# Patient Record
Sex: Female | Born: 1995 | Hispanic: Yes | Marital: Married | Smoking: Never smoker
Health system: Southern US, Community
[De-identification: ages and names within clinical notes are randomized; demographics above are authoritative.]

## PROBLEM LIST (undated history)

## (undated) ENCOUNTER — Emergency Department (HOSPITAL_COMMUNITY): Admission: EM | Payer: Self-pay | Source: Home / Self Care

## (undated) DIAGNOSIS — F32A Depression, unspecified: Secondary | ICD-10-CM

## (undated) DIAGNOSIS — F419 Anxiety disorder, unspecified: Secondary | ICD-10-CM

## (undated) DIAGNOSIS — E039 Hypothyroidism, unspecified: Secondary | ICD-10-CM

## (undated) DIAGNOSIS — F329 Major depressive disorder, single episode, unspecified: Secondary | ICD-10-CM

---
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
## 2013-01-01 ENCOUNTER — Ambulatory Visit (INDEPENDENT_AMBULATORY_CARE_PROVIDER_SITE_OTHER): Payer: BC Managed Care – PPO | Admitting: Family Medicine

## 2013-01-01 VITALS — BP 109/73 | HR 93 | Temp 98.5°F | Resp 18 | Ht 61.0 in | Wt 209.0 lb

## 2013-01-01 DIAGNOSIS — N949 Unspecified condition associated with female genital organs and menstrual cycle: Secondary | ICD-10-CM

## 2013-01-01 DIAGNOSIS — R42 Dizziness and giddiness: Secondary | ICD-10-CM

## 2013-01-01 DIAGNOSIS — N926 Irregular menstruation, unspecified: Secondary | ICD-10-CM

## 2013-01-01 LAB — POCT CBC
HCT, POC: 40.5 % (ref 37.7–47.9)
Lymph, poc: 3 (ref 0.6–3.4)
MCHC: 30.6 g/dL — AB (ref 31.8–35.4)
MCV: 95.7 fL (ref 80–97)
POC Granulocyte: 4.3 (ref 2–6.9)
POC LYMPH PERCENT: 38 %L (ref 10–50)
RDW, POC: 13.5 %

## 2013-01-01 LAB — POCT URINALYSIS DIPSTICK
Nitrite, UA: NEGATIVE
Protein, UA: NEGATIVE
pH, UA: 6.5

## 2013-01-01 LAB — POCT UA - MICROSCOPIC ONLY
Crystals, Ur, HPF, POC: NEGATIVE
Yeast, UA: NEGATIVE

## 2013-01-01 LAB — POCT URINE PREGNANCY: Preg Test, Ur: NEGATIVE

## 2013-01-01 MED ORDER — MECLIZINE HCL 25 MG PO TABS: 25.0000 mg | ORAL_TABLET | Freq: Three times a day (TID) | ORAL | Status: DC | PRN

## 2013-01-01 NOTE — Progress Notes (Signed)
Subjective:  This chart was scribed for Jenny Sorenson, MD, by Yevette Edwards, ED Scribe. This  patient's care was started at 6:38 PM.    Patient ID: Jenny Jackson, female    DOB: Apr 20, 1995, 17 y.o.   MRN: [id number]  HPI HPI Comments: Jenny Jackson is a 17 y.o. female who presents to the [facility] complaining of intermittent lightheadedness which began yesterday and which has been accompanied with the associated symptoms of a frontal headache and nausea. She reports that she has had two episodes of lightheadedness following eating. The pt reports that she experienced the lightheadedness for approximately nine hours yesterday, and she has experienced it for 4 hours today. Her lightheadedness continues at bedside. The pt states the lightheadedness makes her she feel unbalanced and as if she is going to faint, and she states the lightheadedness is exacerbated with movement. The pt states she feels like she is moving when she is stationary, but she denies feeling like the room is spinning. The nausea associated with the lightheadedness is intermittent, but it only occurs when she is feeling lightheaded.    She denies any dysuria, constipation, sleep disturbances, chest pains, palpitations, or appetite changes.   She denies smoking cigarettes.   She had cold symptoms 1-2 weeks ago.  She has a h/o asthma, and she has recently experienced mild asthmatic issue. She reports that she has used her inhaler as needed, but she has not used her inhaler in the past two days.   She stopped taking Zoloft three weeks ago because her insurance did not cover it.  She used Depo, but she reports gaining weight. She will be receiving an IUD next month.   Past Medical History  Diagnosis Date  . Asthma   . Depression   . Anxiety     Current Outpatient Prescriptions on File Prior to Visit  Medication Sig Dispense Refill  . buPROPion (WELLBUTRIN XL) 300 MG 24 hr tablet Take 1 tablet (300 mg total) by mouth daily.  30  tablet  0  . medroxyPROGESTERone (DEPO-PROVERA) 150 MG/ML injection Inject 1 mL (150 mg total) into the muscle every 3 (three) months. Patient may resume injection schedule as previously established by outpatient prescribing providing, and may resume injections with outpatient prescribing provider.      . traZODone (DESYREL) 50 MG tablet Take 1 tablet (50 mg total) by mouth at bedtime.  30 tablet  0   No current facility-administered medications on file prior to visit.   No Known Allergies   Review of Systems  Constitutional: Negative for appetite change.  HENT: Positive for congestion, rhinorrhea and sneezing.   Respiratory: Positive for cough.   Cardiovascular: Negative for chest pain and palpitations.  Gastrointestinal: Positive for nausea. Negative for vomiting.  Genitourinary: Negative for dysuria and difficulty urinating.  Neurological: Positive for light-headedness and headaches. Negative for dizziness.  Psychiatric/Behavioral: Negative for sleep disturbance.    Vitals: BP 122/70  Pulse 81  Temp(Src) 98.5 F (36.9 C) (Oral)  Resp 18  Ht 5\' 1"  (1.549 m)  Wt 209 lb (94.802 kg)  BMI 39.51 kg/m2  SpO2 99%     Objective:   Physical Exam  Nursing note and vitals reviewed. Constitutional: She is oriented to person, place, and time. She appears well-developed and well-nourished. No distress.  HENT:  Head: Normocephalic and atraumatic.  Right Ear: Tympanic membrane is retracted.  Left Ear: Tympanic membrane is retracted.  Nose: Mucosal edema present.  Mouth/Throat: Posterior oropharyngeal erythema present.  Eyes: EOM are  normal.  Neck: Full passive range of motion without pain. Neck supple. No spinous process tenderness and no muscular tenderness present. No tracheal deviation present. No thyromegaly present.  Cardiovascular: Normal rate.   Pulmonary/Chest: Effort normal. No respiratory distress.  Musculoskeletal: Normal range of motion.  Lymphadenopathy:       Head  (right side): No submental, no submandibular, no preauricular and no posterior auricular adenopathy present.       Head (left side): No submental, no submandibular, no preauricular and no posterior auricular adenopathy present.    She has no cervical adenopathy.  Neurological: She is alert and oriented to person, place, and time. She has normal strength and normal reflexes. She displays no atrophy and no tremor. No cranial nerve deficit or sensory deficit. She exhibits normal muscle tone. She displays a negative Romberg sign. Coordination and gait normal.  Negative Dix-Hallpike bilaterally.   Skin: Skin is warm and dry.  Psychiatric: She has a normal mood and affect. Her behavior is normal.      EKG: NSR no ischemic changes Assessment & Plan:   Dizziness and giddiness - Plan: POCT CBC, POCT UA - Microscopic Only, POCT urinalysis dipstick, POCT glucose (manual entry), Comprehensive metabolic panel, Sedimentation Rate, EKG 12-Lead, EKG 12-Lead, Urine culture  Period has come late - Plan: POCT urine pregnancy Unknown etiology of pt's symptoms. I do not think she is having vertigo but will do trial of meclizine anyway. Rec watchful waiting, wait on pending labs, and recheck here in 2d unless sxs have resolved. If cont or worsens at all, cons head imaging. Meds ordered this encounter  Medications  . meclizine (ANTIVERT) 25 MG tablet    Sig: Take 1 tablet (25 mg total) by mouth 3 (three) times daily as needed for dizziness.    Dispense:  30 tablet    Refill:  0    I personally performed the services described in this documentation, which was scribed in my presence. The recorded information has been reviewed and considered, and addended by me as needed.  Jenny Sorenson, MD MPH

## 2013-01-02 LAB — COMPREHENSIVE METABOLIC PANEL
AST: 23 U/L (ref 0–37)
Alkaline Phosphatase: 79 U/L (ref 39–117)
BUN: 11 mg/dL (ref 6–23)
Calcium: 9.2 mg/dL (ref 8.4–10.5)
Creat: 0.69 mg/dL (ref 0.50–1.10)

## 2013-01-04 LAB — URINE CULTURE

## 2013-01-05 ENCOUNTER — Encounter: Payer: Self-pay | Admitting: Family Medicine

## 2015-10-15 DIAGNOSIS — F900 Attention-deficit hyperactivity disorder, predominantly inattentive type: Secondary | ICD-10-CM | POA: Diagnosis present

## 2015-10-15 DIAGNOSIS — E669 Obesity, unspecified: Secondary | ICD-10-CM | POA: Diagnosis present

## 2015-10-15 DIAGNOSIS — Z825 Family history of asthma and other chronic lower respiratory diseases: Secondary | ICD-10-CM

## 2015-10-15 DIAGNOSIS — J45909 Unspecified asthma, uncomplicated: Secondary | ICD-10-CM | POA: Diagnosis present

## 2015-10-15 DIAGNOSIS — Z818 Family history of other mental and behavioral disorders: Secondary | ICD-10-CM

## 2015-10-15 DIAGNOSIS — E039 Hypothyroidism, unspecified: Secondary | ICD-10-CM | POA: Diagnosis present

## 2015-10-15 DIAGNOSIS — K8071 Calculus of gallbladder and bile duct without cholecystitis with obstruction: Principal | ICD-10-CM | POA: Diagnosis present

## 2015-10-15 DIAGNOSIS — Z6841 Body Mass Index (BMI) 40.0 and over, adult: Secondary | ICD-10-CM

## 2015-10-15 DIAGNOSIS — F411 Generalized anxiety disorder: Secondary | ICD-10-CM | POA: Diagnosis present

## 2015-10-16 ENCOUNTER — Inpatient Hospital Stay (HOSPITAL_COMMUNITY)
Admission: EM | Admit: 2015-10-16 | Discharge: 2015-10-16 | DRG: 445 | Disposition: A | Discharge status: Home / Self Care | Payer: Medicaid Other | Attending: Internal Medicine | Admitting: Internal Medicine

## 2015-10-16 ENCOUNTER — Inpatient Hospital Stay (HOSPITAL_COMMUNITY): Payer: Medicaid Other

## 2015-10-16 ENCOUNTER — Telehealth: Payer: Self-pay | Admitting: Gastroenterology

## 2015-10-16 ENCOUNTER — Emergency Department (HOSPITAL_COMMUNITY): Payer: Medicaid Other

## 2015-10-16 ENCOUNTER — Encounter (HOSPITAL_COMMUNITY): Payer: Self-pay | Admitting: *Deleted

## 2015-10-16 DIAGNOSIS — Z818 Family history of other mental and behavioral disorders: Secondary | ICD-10-CM | POA: Diagnosis not present

## 2015-10-16 DIAGNOSIS — F411 Generalized anxiety disorder: Secondary | ICD-10-CM | POA: Diagnosis present

## 2015-10-16 DIAGNOSIS — R945 Abnormal results of liver function studies: Secondary | ICD-10-CM

## 2015-10-16 DIAGNOSIS — Z6841 Body Mass Index (BMI) 40.0 and over, adult: Secondary | ICD-10-CM | POA: Diagnosis not present

## 2015-10-16 DIAGNOSIS — R748 Abnormal levels of other serum enzymes: Secondary | ICD-10-CM

## 2015-10-16 DIAGNOSIS — K8050 Calculus of bile duct without cholangitis or cholecystitis without obstruction: Secondary | ICD-10-CM | POA: Insufficient documentation

## 2015-10-16 DIAGNOSIS — K8071 Calculus of gallbladder and bile duct without cholecystitis with obstruction: Secondary | ICD-10-CM | POA: Diagnosis present

## 2015-10-16 DIAGNOSIS — J45909 Unspecified asthma, uncomplicated: Secondary | ICD-10-CM | POA: Diagnosis present

## 2015-10-16 DIAGNOSIS — E039 Hypothyroidism, unspecified: Secondary | ICD-10-CM | POA: Diagnosis present

## 2015-10-16 DIAGNOSIS — F900 Attention-deficit hyperactivity disorder, predominantly inattentive type: Secondary | ICD-10-CM | POA: Diagnosis present

## 2015-10-16 DIAGNOSIS — K8051 Calculus of bile duct without cholangitis or cholecystitis with obstruction: Secondary | ICD-10-CM | POA: Diagnosis present

## 2015-10-16 DIAGNOSIS — R109 Unspecified abdominal pain: Secondary | ICD-10-CM

## 2015-10-16 DIAGNOSIS — E669 Obesity, unspecified: Secondary | ICD-10-CM | POA: Diagnosis present

## 2015-10-16 DIAGNOSIS — Z825 Family history of asthma and other chronic lower respiratory diseases: Secondary | ICD-10-CM | POA: Diagnosis not present

## 2015-10-16 DIAGNOSIS — R1013 Epigastric pain: Secondary | ICD-10-CM | POA: Diagnosis not present

## 2015-10-16 DIAGNOSIS — R7989 Other specified abnormal findings of blood chemistry: Secondary | ICD-10-CM

## 2015-10-16 HISTORY — DX: Hypothyroidism, unspecified: E03.9

## 2015-10-16 LAB — CBC WITH DIFFERENTIAL/PLATELET
Basophils Absolute: 0 10*3/uL (ref 0.0–0.1)
Basophils Relative: 0 %
EOS ABS: 0.4 10*3/uL (ref 0.0–0.7)
EOS PCT: 5 %
HEMATOCRIT: 39.2 % (ref 36.0–46.0)
HEMOGLOBIN: 12.6 g/dL (ref 12.0–15.0)
LYMPHS ABS: 3.6 10*3/uL (ref 0.7–4.0)
Lymphocytes Relative: 50 %
MCH: 27.9 pg (ref 26.0–34.0)
MCHC: 32.1 g/dL (ref 30.0–36.0)
MCV: 86.9 fL (ref 78.0–100.0)
MONOS PCT: 8 %
Monocytes Absolute: 0.6 10*3/uL (ref 0.1–1.0)
Neutro Abs: 2.7 10*3/uL (ref 1.7–7.7)
Neutrophils Relative %: 37 %
Platelets: 285 10*3/uL (ref 150–400)
RBC: 4.51 MIL/uL (ref 3.87–5.11)
RDW: 15.4 % (ref 11.5–15.5)
WBC: 7.3 10*3/uL (ref 4.0–10.5)

## 2015-10-16 LAB — COMPREHENSIVE METABOLIC PANEL
ALBUMIN: 3.9 g/dL (ref 3.5–5.0)
ALK PHOS: 193 U/L — AB (ref 38–126)
ALK PHOS: 221 U/L — AB (ref 38–126)
ALT: 339 U/L — ABNORMAL HIGH (ref 14–54)
ALT: 385 U/L — AB (ref 14–54)
ANION GAP: 10 (ref 5–15)
AST: 172 U/L — AB (ref 15–41)
AST: 207 U/L — ABNORMAL HIGH (ref 15–41)
Albumin: 4.5 g/dL (ref 3.5–5.0)
Anion gap: 6 (ref 5–15)
BILIRUBIN TOTAL: 0.9 mg/dL (ref 0.3–1.2)
BILIRUBIN TOTAL: 1.1 mg/dL (ref 0.3–1.2)
BUN: 10 mg/dL (ref 6–20)
BUN: 9 mg/dL (ref 6–20)
CALCIUM: 8.9 mg/dL (ref 8.9–10.3)
CALCIUM: 9 mg/dL (ref 8.9–10.3)
CO2: 24 mmol/L (ref 22–32)
CO2: 25 mmol/L (ref 22–32)
CREATININE: 0.63 mg/dL (ref 0.44–1.00)
Chloride: 106 mmol/L (ref 101–111)
Chloride: 106 mmol/L (ref 101–111)
Creatinine, Ser: 0.63 mg/dL (ref 0.44–1.00)
GFR calc Af Amer: >60 mL/min (ref >60)
GFR calc non Af Amer: >60 mL/min (ref >60)
GFR calc non Af Amer: >60 mL/min (ref >60)
GLUCOSE: 94 mg/dL (ref 65–99)
Glucose, Bld: 99 mg/dL (ref 65–99)
POTASSIUM: 3.7 mmol/L (ref 3.5–5.1)
Potassium: 3.7 mmol/L (ref 3.5–5.1)
SODIUM: 136 mmol/L (ref 135–145)
SODIUM: 141 mmol/L (ref 135–145)
TOTAL PROTEIN: 7.1 g/dL (ref 6.5–8.1)
TOTAL PROTEIN: 8.1 g/dL (ref 6.5–8.1)

## 2015-10-16 LAB — CBC
HEMATOCRIT: 36.4 % (ref 36.0–46.0)
HEMOGLOBIN: 11.7 g/dL — AB (ref 12.0–15.0)
MCH: 28 pg (ref 26.0–34.0)
MCHC: 32.1 g/dL (ref 30.0–36.0)
MCV: 87.1 fL (ref 78.0–100.0)
Platelets: 255 10*3/uL (ref 150–400)
RBC: 4.18 MIL/uL (ref 3.87–5.11)
RDW: 15.5 % (ref 11.5–15.5)
WBC: 5.6 10*3/uL (ref 4.0–10.5)

## 2015-10-16 LAB — LIPASE, BLOOD: Lipase: 31 U/L (ref 11–51)

## 2015-10-16 LAB — URINALYSIS, ROUTINE W REFLEX MICROSCOPIC
Bilirubin Urine: NEGATIVE
GLUCOSE, UA: NEGATIVE mg/dL
Hgb urine dipstick: NEGATIVE
KETONES UR: NEGATIVE mg/dL
NITRITE: NEGATIVE
PH: 6.5 (ref 5.0–8.0)
Protein, ur: NEGATIVE mg/dL
SPECIFIC GRAVITY, URINE: 1.025 (ref 1.005–1.030)

## 2015-10-16 LAB — URINE MICROSCOPIC-ADD ON

## 2015-10-16 LAB — PREGNANCY, URINE: Preg Test, Ur: NEGATIVE

## 2015-10-16 MED ORDER — FAMOTIDINE IN NACL 20-0.9 MG/50ML-% IV SOLN
20.0000 mg | Freq: Once | INTRAVENOUS | Status: AC
  Administered 2015-10-16: 20 mg via INTRAVENOUS
  Filled 2015-10-16: qty 50

## 2015-10-16 MED ORDER — ONDANSETRON HCL 4 MG PO TABS: 4.0000 mg | ORAL_TABLET | Freq: Four times a day (QID) | ORAL | Status: DC | PRN

## 2015-10-16 MED ORDER — SODIUM CHLORIDE 0.9 % IV SOLN
INTRAVENOUS | Status: DC
  Administered 2015-10-16: 07:00:00 via INTRAVENOUS

## 2015-10-16 MED ORDER — GI COCKTAIL ~~LOC~~
30.0000 mL | Freq: Once | ORAL | Status: AC
  Administered 2015-10-16: 30 mL via ORAL
  Filled 2015-10-16: qty 30

## 2015-10-16 MED ORDER — FENTANYL CITRATE (PF) 100 MCG/2ML IJ SOLN
INTRAMUSCULAR | Status: AC
  Filled 2015-10-16: qty 2

## 2015-10-16 MED ORDER — FENTANYL CITRATE (PF) 100 MCG/2ML IJ SOLN
50.0000 ug | Freq: Once | INTRAMUSCULAR | Status: AC
Start: 2015-10-16 — End: 2015-10-16
  Administered 2015-10-16: 50 ug via INTRAVENOUS

## 2015-10-16 MED ORDER — SODIUM CHLORIDE 0.9 % IV BOLUS (SEPSIS)
1000.0000 mL | Freq: Once | INTRAVENOUS | Status: AC
  Administered 2015-10-16: 1000 mL via INTRAVENOUS

## 2015-10-16 MED ORDER — HYDROMORPHONE HCL 1 MG/ML IJ SOLN: 0.5000 mg | INTRAMUSCULAR | Status: DC | PRN

## 2015-10-16 MED ORDER — AMPICILLIN-SULBACTAM SODIUM 3 (2-1) G IJ SOLR
3.0000 g | Freq: Once | INTRAMUSCULAR | Status: AC
  Administered 2015-10-16: 3 g via INTRAVENOUS
  Filled 2015-10-16: qty 3

## 2015-10-16 MED ORDER — IOPAMIDOL (ISOVUE-300) INJECTION 61%
100.0000 mL | Freq: Once | INTRAVENOUS | Status: AC | PRN
  Administered 2015-10-16: 100 mL via INTRAVENOUS

## 2015-10-16 MED ORDER — SODIUM CHLORIDE 0.9 % IV BOLUS (SEPSIS)
500.0000 mL | Freq: Once | INTRAVENOUS | Status: AC
  Administered 2015-10-16: 500 mL via INTRAVENOUS

## 2015-10-16 MED ORDER — SODIUM CHLORIDE 0.9 % IV SOLN: INTRAVENOUS | Status: DC

## 2015-10-16 MED ORDER — ONDANSETRON HCL 4 MG/2ML IJ SOLN: 4.0000 mg | Freq: Four times a day (QID) | INTRAMUSCULAR | Status: DC | PRN

## 2015-10-16 MED ORDER — DIATRIZOATE MEGLUMINE & SODIUM 66-10 % PO SOLN
ORAL | Status: AC
  Administered 2015-10-16: 30 mL
  Filled 2015-10-16: qty 30

## 2015-10-16 MED ORDER — FENTANYL CITRATE (PF) 100 MCG/2ML IJ SOLN
50.0000 ug | Freq: Once | INTRAMUSCULAR | Status: AC
  Administered 2015-10-16: 50 ug via INTRAVENOUS
  Filled 2015-10-16: qty 2

## 2015-10-16 NOTE — H&P (Signed)
History and Physical    Jenny Jackson DZH:[id number] DOB: 06/17/1995 DOA: 10/16/2015  PCP: Pcp Not In System  Patient coming from:  home  Chief Complaint:   Abdominal pain and vomiting  HPI: Jenny Jackson is a 20 y.o. female postpartum 2 months s/p csection for OP position and delayed second phase of labor with hypothyroidism diagnosed during pregnancy breast feeding comes in with worsening progressive abdominal pain in the epigastrum for over 6 days.  Pt went to the health dept last week when the pain was very vague and thought it was due to muscle spasms in her back.  Pt progressively got worse and the pain localized in the epigastrum became more frequent and worse postprandial.  She denies any fevers.  No diarrhea.  Still has her gallbladder.  She does not have a headache or peripheral swelling.  She did not have any h/o high blood pressure during her pregnancy.  She did have postpartum hemorrhage requiring 2 units prbc transfusion prior to her discharge.  Her breast feeding has not been interrupted by this illness.  Pt found to have elevated lfts and alk phos and cbd dilation, referred for admission for possible gallstone with obstruction.   Review of Systems: As per HPI otherwise 10 point review of systems negative.   Past Medical History:  Diagnosis Date  . Anxiety   . Asthma   . Depression   . Hypothyroidism     Past Surgical History:  Procedure Laterality Date  . CESAREAN SECTION       reports that she has never smoked. She has never used smokeless tobacco. She reports that she does not drink alcohol or use drugs.  No Known Allergies  Family History  Problem Relation Age of Onset  . Asthma Mother   . Depression Mother   . Hypotension Mother   . Depression Sister   . Asthma Sister   . Seizures Sister   . Cancer Maternal Aunt     Breast, and mouth  . Cancer Maternal Grandfather     Prior to Admission medications   Medication Sig Start Date End Date  Taking? Authorizing Provider  LEVOTHYROXINE SODIUM PO Take by mouth.   Yes Historical Provider, MD  meclizine (ANTIVERT) 25 MG tablet Take 1 tablet (25 mg total) by mouth 3 (three) times daily as needed for dizziness. 01/01/13   Shawnee Knapp, MD    Physical Exam: Vitals:   10/16/15 0010 10/16/15 0355  BP: 141/93 128/90  Pulse: 75 86  Resp: 22 18  Temp: 98.2 F (36.8 C)   SpO2: 98% 98%  Weight: 105.2 kg (232 lb)   Height: 5' 1" (1.549 m)       Constitutional: NAD, calm, comfortable Vitals:   10/16/15 0010 10/16/15 0355  BP: 141/93 128/90  Pulse: 75 86  Resp: 22 18  Temp: 98.2 F (36.8 C)   SpO2: 98% 98%  Weight: 105.2 kg (232 lb)   Height: 5' 1" (1.549 m)    Eyes: PERRL, lids and conjunctivae normal ENMT: Mucous membranes are moist. Posterior pharynx clear of any exudate or lesions.Normal dentition.  Neck: normal, supple, no masses, no thyromegaly Respiratory: clear to auscultation bilaterally, no wheezing, no crackles. Normal respiratory effort. No accessory muscle use.  Cardiovascular: Regular rate and rhythm, no murmurs / rubs / gallops. No extremity edema. 2+ pedal pulses. No carotid bruits.  Abdomen: epigastrum tenderness, no masses palpated. No hepatosplenomegaly. Bowel sounds positive.  Musculoskeletal: no clubbing / cyanosis. No joint  deformity upper and lower extremities. Good ROM, no contractures. Normal muscle tone.  Skin: no rashes, lesions, ulcers. No induration Neurologic: CN 2-12 grossly intact. Sensation intact, DTR normal. Strength 5/5 in all 4.  Psychiatric: Normal judgment and insight. Alert and oriented x 3. Normal mood.    Labs on Admission: I have personally reviewed following labs and imaging studies  CBC:  Recent Labs Lab 10/16/15 0025  WBC 7.3  NEUTROABS 2.7  HGB 12.6  HCT 39.2  MCV 86.9  PLT 539   Basic Metabolic Panel:  Recent Labs Lab 10/16/15 0025  NA 136  K 3.7  CL 106  CO2 24  GLUCOSE 99  BUN 10  CREATININE 0.63    CALCIUM 8.9   GFR: Estimated Creatinine Clearance: 124.3 mL/min (by C-G formula based on SCr of 0.8 mg/dL). Liver Function Tests:  Recent Labs Lab 10/16/15 0025  AST 207*  ALT 385*  ALKPHOS 221*  BILITOT 1.1  PROT 8.1  ALBUMIN 4.5    Recent Labs Lab 10/16/15 0025  LIPASE 31   Urine analysis:    Component Value Date/Time   COLORURINE AMBER (A) 10/16/2015 0025   APPEARANCEUR CLEAR 10/16/2015 0025   LABSPEC 1.025 10/16/2015 0025   PHURINE 6.5 10/16/2015 0025   GLUCOSEU NEGATIVE 10/16/2015 0025   HGBUR NEGATIVE 10/16/2015 0025   BILIRUBINUR NEGATIVE 10/16/2015 0025   BILIRUBINUR neg 01/01/2013 1853   KETONESUR NEGATIVE 10/16/2015 0025   PROTEINUR NEGATIVE 10/16/2015 0025   UROBILINOGEN 0.2 01/01/2013 1853   UROBILINOGEN 0.2 08/09/2012 1749   NITRITE NEGATIVE 10/16/2015 0025   LEUKOCYTESUR TRACE (A) 10/16/2015 0025   Radiological Exams on Admission: Ct Abdomen Pelvis W Contrast  Result Date: 10/16/2015 CLINICAL DATA:  Acute onset of intermittent epigastric abdominal pain, nausea and vomiting. Initial encounter. EXAM: CT ABDOMEN AND PELVIS WITH CONTRAST TECHNIQUE: Multidetector CT imaging of the abdomen and pelvis was performed using the standard protocol following bolus administration of intravenous contrast. CONTRAST:  140m ISOVUE-300 IOPAMIDOL (ISOVUE-300) INJECTION 61% COMPARISON:  Pelvic ultrasound performed 03/21/2015 FINDINGS: The visualized lung bases are clear. The spleen is unremarkable in appearance. Minimally increased attenuation within the gallbladder may reflect tiny stones. The gallbladder is mildly distended but otherwise unremarkable. There is prominence of the intrahepatic biliary ducts, with distention of the common bile duct to 1.1 cm, concerning for distal obstruction. The pancreas and adrenal glands are unremarkable. The kidneys are unremarkable in appearance. There is no evidence of hydronephrosis. No renal or ureteral stones are seen. No perinephric  stranding is appreciated. No free fluid is identified. The small bowel is unremarkable in appearance. The stomach is within normal limits. No acute vascular abnormalities are seen. The appendix is normal in caliber, without evidence of appendicitis. The colon is unremarkable in appearance. The bladder is mildly distended and grossly unremarkable. The uterus is unremarkable in appearance. The ovaries are grossly symmetric. No suspicious adnexal masses are seen. No inguinal lymphadenopathy is seen. No acute osseous abnormalities are identified. IMPRESSION: 1. Prominence of the intrahepatic biliary ducts, with distention of the common bile duct to 1.1 cm, concerning for distal obstruction. The patient's LFTs raise question for underlying hepatitis. MRCP or ERCP would be helpful for further evaluation, as deemed clinically appropriate. 2. Question of tiny stones within the gallbladder. The gallbladder is mildly distended but otherwise unremarkable. Electronically Signed   By: JGarald BaldingM.D.   On: 10/16/2015 03:14    Assessment/Plan 20yo female G1P1 postpartum 2 months with possible acute cholecystitis with CBD dilation/possible  stone  Principal Problem:   Gallstone of bile duct with obstruction and possible acute cholecystitis - given a dose of unasyn in the ED.  Will not continue.  Obtain GI consult in am for possible ERCP.  Obtain abdominal ultrasound.  Ivf.  Npo status.  May need eventual surgical involvement.  Abdominal exam nonacute   Active Problems:   Asthma- stable   GAD (generalized anxiety disorder)- stable   ADHD (attention deficit hyperactivity disorder), inattentive type- stable   Postpartum state- noted, breast feeding status also    Admit to medical bed.  DVT prophylaxis: scds , early ambulation Code Status:  Full code  DAVID,RACHAL A MD [facility]  If 7PM-7AM, please contact night-coverage [email or web address] Password [email or web address]  10/16/2015, 4:22 AM

## 2015-10-16 NOTE — ED Provider Notes (Signed)
[facility]-EMERGENCY DEPT Provider Note   CSN: [id number] Arrival date & time: 10/15/15  2356  First Provider Contact:  First MD Initiated Contact with Patient 10/16/15 0010     By signing my name below, I, Vista Mink, attest that this documentation has been prepared under the direction and in the presence of Devoria Albe, MD. Electronically signed, Vista Mink, ED Scribe. 10/16/15. 1:25 AM.   History   Chief Complaint Chief Complaint  Patient presents with  . Abdominal Pain    HPI HPI Comments: Jenny Jackson is a 20 y.o. female who presents to the Emergency Department complaining of intermittent abdominal pain for the past week. Pt reports worst pain to her epigastric region. Pt states that she believes it is due to GERD and reports a burning sensation in her chest and burning fluid in her throat. Pt also states that her pain is exacerbated by taking deep breath and reports pain across her upper abdomen. Pt further reports intermittent nausea within the past week with one episode of vomiting. Pt states that her abdominal pain is provoked after eating but otherwise presents randomly and at night when laying down. Pt has taken TUMS with no relief of symptoms. Pt reports relief of abdominal pain after vomiting but states that the pain returned shortly after. Pt states she was seen on 10/12/15 at [facility] for same symptoms. Pt reports that one of the nurses told her that she could not give her medication due to the fact that she was currently nursing a 5m.o. Pt reports the heartburn startednear the end of her pregnancy; no Hx prior and no medication for it during pregnancy except for TUMs which did work. Her pain started tonight about 2.5 hours ago and is not improving.    The history is provided by the patient. No language interpreter was used.   PCP [facility] GYN Dr Ralph Dowdy  Past Medical History:  Diagnosis Date  . Anxiety   . Asthma   . Depression    . Hypothyroidism     Patient Active Problem List   Diagnosis Date Noted  . Gallstone of bile duct with obstruction 10/16/2015  . MDD (major depressive disorder), recurrent episode, severe (HCC) 08/09/2012  . GAD (generalized anxiety disorder) 08/09/2012  . ADHD (attention deficit hyperactivity disorder), inattentive type 08/09/2012  . ASTHMA 04/22/2010  . FRACTURE, RIGHT GREAT TOE 04/22/2010    Past Surgical History:  Procedure Laterality Date  . CESAREAN SECTION      OB History    No data available       Home Medications    Prior to Admission medications   Medication Sig Start Date End Date Taking? Authorizing Provider  LEVOTHYROXINE SODIUM PO Take by mouth.   Yes Historical Provider, MD  meclizine (ANTIVERT) 25 MG tablet Take 1 tablet (25 mg total) by mouth 3 (three) times daily as needed for dizziness. 01/01/13   Sherren Mocha, MD    Family History Family History  Problem Relation Age of Onset  . Asthma Mother   . Depression Mother   . Hypotension Mother   . Depression Sister   . Asthma Sister   . Seizures Sister   . Cancer Maternal Aunt     Breast, and mouth  . Cancer Maternal Grandfather     Social History Social History  Substance Use Topics  . Smoking status: Never Smoker  . Smokeless tobacco: Never Used  . Alcohol use No     Allergies  Review of patient's allergies indicates no known allergies.   Review of Systems Review of Systems  Constitutional: Negative for fever.  Gastrointestinal: Positive for abdominal pain, nausea and vomiting.  All other systems reviewed and are negative.    Physical Exam Updated Vital Signs BP 128/90   Pulse 86   Temp 98.2 F (36.8 C)   Resp 18   Ht 5\' 1"  (1.549 m)   Wt 232 lb (105.2 kg)   SpO2 98%   BMI 43.84 kg/m   Physical Exam  Constitutional: She is oriented to person, place, and time. She appears well-developed and well-nourished.  Non-toxic appearance. She does not appear ill. She appears  distressed.  Gets tearful  HENT:  Head: Normocephalic and atraumatic.  Right Ear: External ear normal.  Left Ear: External ear normal.  Nose: Nose normal. No mucosal edema or rhinorrhea.  Mouth/Throat: Oropharynx is clear and moist and mucous membranes are normal. No dental abscesses or uvula swelling.  Eyes: Conjunctivae and EOM are normal. Pupils are equal, round, and reactive to light.  Neck: Normal range of motion and full passive range of motion without pain. Neck supple.  Cardiovascular: Normal rate, regular rhythm and normal heart sounds.  Exam reveals no gallop and no friction rub.   No murmur heard. Pulmonary/Chest: Effort normal and breath sounds normal. No respiratory distress. She has no wheezes. She has no rhonchi. She has no rales. She exhibits no tenderness and no crepitus.  Abdominal: Soft. Normal appearance and bowel sounds are normal. She exhibits no distension. There is tenderness. There is no rebound and no guarding.    Very tender RUQ, less so in the epigastric region  Musculoskeletal: Normal range of motion. She exhibits no edema or tenderness.  Moves all extremities well.   Neurological: She is alert and oriented to person, place, and time. She has normal strength. No cranial nerve deficit.  Skin: Skin is warm, dry and intact. No rash noted. No erythema. No pallor.  Psychiatric: She has a normal mood and affect. Her speech is normal and behavior is normal. Her mood appears not anxious.  Nursing note and vitals reviewed.    ED Treatments / Results  DIAGNOSTIC STUDIES: Oxygen Saturation is 98% on RA, normal by my interpretation.  COORDINATION OF CARE: 12:15 AM-Will order blood work and GI cocktail. Discussed treatment plan with pt at bedside and pt agreed to plan.   Labs (all labs ordered are listed, but only abnormal results are displayed) Results for orders placed or performed during the hospital encounter of 10/16/15  Comprehensive metabolic panel  Result  Value Ref Range   Sodium 136 135 - 145 mmol/L   Potassium 3.7 3.5 - 5.1 mmol/L   Chloride 106 101 - 111 mmol/L   CO2 24 22 - 32 mmol/L   Glucose, Bld 99 65 - 99 mg/dL   BUN 10 6 - 20 mg/dL   Creatinine, Ser 0.980.63 0.44 - 1.00 mg/dL   Calcium 8.9 8.9 - 11.910.3 mg/dL   Total Protein 8.1 6.5 - 8.1 g/dL   Albumin 4.5 3.5 - 5.0 g/dL   AST 147207 (H) 15 - 41 U/L   ALT 385 (H) 14 - 54 U/L   Alkaline Phosphatase 221 (H) 38 - 126 U/L   Total Bilirubin 1.1 0.3 - 1.2 mg/dL   GFR calc non Af Amer >60 >60 mL/min   GFR calc Af Amer >60 >60 mL/min   Anion gap 6 5 - 15  Lipase, blood  Result Value  Ref Range   Lipase 31 11 - 51 U/L  CBC with Differential  Result Value Ref Range   WBC 7.3 4.0 - 10.5 K/uL   RBC 4.51 3.87 - 5.11 MIL/uL   Hemoglobin 12.6 12.0 - 15.0 g/dL   HCT 69.6 29.5 - 28.4 %   MCV 86.9 78.0 - 100.0 fL   MCH 27.9 26.0 - 34.0 pg   MCHC 32.1 30.0 - 36.0 g/dL   RDW 13.2 44.0 - 10.2 %   Platelets 285 150 - 400 K/uL   Neutrophils Relative % 37 %   Neutro Abs 2.7 1.7 - 7.7 K/uL   Lymphocytes Relative 50 %   Lymphs Abs 3.6 0.7 - 4.0 K/uL   Monocytes Relative 8 %   Monocytes Absolute 0.6 0.1 - 1.0 K/uL   Eosinophils Relative 5 %   Eosinophils Absolute 0.4 0.0 - 0.7 K/uL   Basophils Relative 0 %   Basophils Absolute 0.0 0.0 - 0.1 K/uL  Urinalysis, Routine w reflex microscopic  Result Value Ref Range   Color, Urine AMBER (A) YELLOW   APPearance CLEAR CLEAR   Specific Gravity, Urine 1.025 1.005 - 1.030   pH 6.5 5.0 - 8.0   Glucose, UA NEGATIVE NEGATIVE mg/dL   Hgb urine dipstick NEGATIVE NEGATIVE   Bilirubin Urine NEGATIVE NEGATIVE   Ketones, ur NEGATIVE NEGATIVE mg/dL   Protein, ur NEGATIVE NEGATIVE mg/dL   Nitrite NEGATIVE NEGATIVE   Leukocytes, UA TRACE (A) NEGATIVE  Urine microscopic-add on  Result Value Ref Range   Squamous Epithelial / LPF 0-5 (A) NONE SEEN   WBC, UA 0-5 0 - 5 WBC/hpf   RBC / HPF 0-5 0 - 5 RBC/hpf   Bacteria, UA FEW (A) NONE SEEN   Urine-Other MUCOUS  PRESENT   Pregnancy, urine  Result Value Ref Range   Preg Test, Ur NEGATIVE NEGATIVE   Laboratory interpretation all normal except new elevation of LFT's    EKG  EKG Interpretation None       Radiology Ct Abdomen Pelvis W Contrast  Result Date: 10/16/2015 CLINICAL DATA:  Acute onset of intermittent epigastric abdominal pain, nausea and vomiting. Initial encounter. EXAM: CT ABDOMEN AND PELVIS WITH CONTRAST TECHNIQUE: Multidetector CT imaging of the abdomen and pelvis was performed using the standard protocol following bolus administration of intravenous contrast. CONTRAST:  ISOVUE-300 IOPAMIDOL (ISOVUE-300) INJECTION 61% COMPARISON:  Pelvic ultrasound performed 03/21/2015 FINDINGS: The visualized lung bases are clear. The spleen is unremarkable in appearance. Minimally increased attenuation within the gallbladder may reflect tiny stones. The gallbladder is mildly distended but otherwise unremarkable. There is prominence of the intrahepatic biliary ducts, with distention of the common bile duct to 1.1 cm, concerning for distal obstruction. The pancreas and adrenal glands are unremarkable. The kidneys are unremarkable in appearance. There is no evidence of hydronephrosis. No renal or ureteral stones are seen. No perinephric stranding is appreciated. No free fluid is identified. The small bowel is unremarkable in appearance. The stomach is within normal limits. No acute vascular abnormalities are seen. The appendix is normal in caliber, without evidence of appendicitis. The colon is unremarkable in appearance. The bladder is mildly distended and grossly unremarkable. The uterus is unremarkable in appearance. The ovaries are grossly symmetric. No suspicious adnexal masses are seen. No inguinal lymphadenopathy is seen. No acute osseous abnormalities are identified. IMPRESSION: 1. Prominence of the intrahepatic biliary ducts, with distention of the common bile duct to 1.1 cm, concerning for distal  obstruction. The patient's LFTs raise question  for underlying hepatitis. MRCP or ERCP would be helpful for further evaluation, as deemed clinically appropriate. 2. Question of tiny stones within the gallbladder. The gallbladder is mildly distended but otherwise unremarkable. Electronically Signed   By: Roanna Raider M.D.   On: 10/16/2015 03:14    Procedures Procedures (including critical care time)  Medications Ordered in ED Medications  Ampicillin-Sulbactam (UNASYN) 3 g in sodium chloride 0.9 % 100 mL IVPB (3 g Intravenous New Bag/Given 10/16/15 0407)  sodium chloride 0.9 % bolus 1,000 mL (0 mLs Intravenous Stopped 10/16/15 0116)  sodium chloride 0.9 % bolus 500 mL (0 mLs Intravenous Stopped 10/16/15 0140)  famotidine (PEPCID) IVPB 20 mg premix (0 mg Intravenous Stopped 10/16/15 0103)  gi cocktail (Maalox,Lidocaine,Donnatal) (30 mLs Oral Given 10/16/15 0036)  fentaNYL (SUBLIMAZE) injection 50 mcg (50 mcg Intravenous Given 10/16/15 0130)  iopamidol (ISOVUE-300) 61 % injection 100 mL (100 mLs Intravenous Contrast Given 10/16/15 0259)  diatrizoate meglumine-sodium (GASTROGRAFIN) 66-10 % solution (30 mLs  Given 10/16/15 0259)  fentaNYL (SUBLIMAZE) injection 50 mcg (50 mcg Intravenous Given 10/16/15 0353)     Initial Impression / Assessment and Plan / ED Course  I have reviewed the triage vital signs and the nursing notes.  Pertinent labs & imaging results that were available during my care of the patient were reviewed by me and considered in my medical decision making (see chart for details).  Clinical Course   Pt was given IV fluids, IV pepcid and oral GI cocktail for her pain. She states she is nursing a 26 month old and is concerned, if this doesn't help will then go to something stronger. She states she has "some" milk frozen for use at home.  01:29 Pt is still having pain, she was advised to nurse the baby and was given her test results. [place] not available at night, will proceed with CT scan to look for  cholecystitis.   03:45 AM discussed her CT results and need for admission. She states her baby will take a bottle. She was started on unasyn.   04:12 AM Dr Onalee Hua, admit to med-surg  Final Clinical Impressions(s) / ED Diagnoses   Final diagnoses:  Gallstones, common bile duct    Plan admission  Devoria Albe, MD, FACEP   I personally performed the services described in this documentation, which was scribed in my presence. The recorded information has been reviewed and considered.  Devoria Albe, MD, Concha Pyo, MD 10/16/15 [phone]

## 2015-10-16 NOTE — Discharge Instructions (Signed)
FOLLOW A LOW FAT DIET. MEATS SHOULD BE BAKED, BROILED, OR BOILED. AVOID FRIED FOODS. AVOID ITEMS THAT CAUSE GAS AND BLOATING. SEE INFO BELOW.   HAVE LIVER PANEL CHECKED NEXT TUESDAY.  SEE GENERAL SURGERY IN 2-4 WEEKS.  Low-Fat Diet BREADS, CEREALS, PASTA, RICE, DRIED PEAS, AND BEANS These products are high in carbohydrates and most are low in fat. Therefore, they can be increased in the diet as substitutes for fatty foods. They too, however, contain calories and should not be eaten in excess. Cereals can be eaten for snacks as well as for breakfast.   FRUITS AND VEGETABLES It is good to eat fruits and vegetables. Besides being sources of fiber, both are rich in vitamins and some minerals. They help you get the daily allowances of these nutrients. Fruits and vegetables can be used for snacks and desserts.  MEATS Limit lean meat, chicken, [place], and fish to no more than 6 ounces per day. Beef, Pork, and Lamb Use lean cuts of beef, pork, and lamb. Lean cuts include:  Extra-lean ground beef.  Arm roast.  Sirloin tip.  Center-cut ham.  Round steak.  Loin chops.  Rump roast.  Tenderloin.  Trim all fat off the outside of meats before cooking. It is not necessary to severely decrease the intake of red meat, but lean choices should be made. Lean meat is rich in protein and contains a highly absorbable form of iron. Premenopausal women, in particular, should avoid reducing lean red meat because this could increase the risk for low red blood cells (iron-deficiency anemia).  Chicken and [place] These are good sources of protein. The fat of poultry can be reduced by removing the skin and underlying fat layers before cooking. Chicken and [place] can be substituted for lean red meat in the diet. Poultry should not be fried or covered with high-fat sauces. Fish and Shellfish Fish is a good source of protein. Shellfish contain cholesterol, but they usually are low in saturated fatty acids. The  preparation of fish is important. Like chicken and [place], they should not be fried or covered with high-fat sauces. EGGS Egg whites contain no fat or cholesterol. They can be eaten often. Try 1 to 2 egg whites instead of whole eggs in recipes or use egg substitutes that do not contain yolk. MILK AND DAIRY PRODUCTS Use skim or 1% milk instead of 2% or whole milk. Decrease whole milk, natural, and processed cheeses. Use nonfat or low-fat (2%) cottage cheese or low-fat cheeses made from vegetable oils. Choose nonfat or low-fat (1 to 2%) yogurt. Experiment with evaporated skim milk in recipes that call for heavy cream. Substitute low-fat yogurt or low-fat cottage cheese for sour cream in dips and salad dressings. Have at least 2 servings of low-fat dairy products, such as 2 glasses of skim (or 1%) milk each day to help get your daily calcium intake. FATS AND OILS Reduce the total intake of fats, especially saturated fat. Butterfat, lard, and beef fats are high in saturated fat and cholesterol. These should be avoided as much as possible. Vegetable fats do not contain cholesterol, but certain vegetable fats, such as coconut oil, palm oil, and palm kernel oil are very high in saturated fats. These should be limited. These fats are often used in bakery goods, processed foods, popcorn, oils, and nondairy creamers. Vegetable shortenings and some peanut butters contain hydrogenated oils, which are also saturated fats. Read the labels on these foods and check for saturated vegetable oils. Unsaturated vegetable oils and fats  do not raise blood cholesterol. However, they should be limited because they are fats and are high in calories. Total fat should still be limited to 30% of your daily caloric intake. Desirable liquid vegetable oils are corn oil, cottonseed oil, olive oil, canola oil, safflower oil, soybean oil, and sunflower oil. Peanut oil is not as good, but small amounts are acceptable. Buy a heart-healthy tub  margarine that has no partially hydrogenated oils in the ingredients. Mayonnaise and salad dressings often are made from unsaturated fats, but they should also be limited because of their high calorie and fat content. Seeds, nuts, peanut butter, olives, and avocados are high in fat, but the fat is mainly the unsaturated type. These foods should be limited mainly to avoid excess calories and fat. OTHER EATING TIPS Snacks  Most sweets should be limited as snacks. They tend to be rich in calories and fats, and their caloric content outweighs their nutritional value. Some good choices in snacks are graham crackers, melba toast, soda crackers, bagels (no egg), English muffins, fruits, and vegetables. These snacks are preferable to snack crackers, [place] fries, TORTILLA CHIPS, and POTATO chips. Popcorn should be air-popped or cooked in small amounts of liquid vegetable oil. Desserts Eat fruit, low-fat yogurt, and fruit ices instead of pastries, cake, and cookies. Sherbet, angel food cake, gelatin dessert, frozen low-fat yogurt, or other frozen products that do not contain saturated fat (pure fruit juice bars, frozen ice pops) are also acceptable.  COOKING METHODS Choose those methods that use little or no fat. They include: Poaching.  Braising.  Steaming.  Grilling.  Baking.  Stir-frying.  Broiling.  Microwaving.  Foods can be cooked in a nonstick pan without added fat, or use a nonfat cooking spray in regular cookware. Limit fried foods and avoid frying in saturated fat. Add moisture to lean meats by using water, broth, cooking wines, and other nonfat or low-fat sauces along with the cooking methods mentioned above. Soups and stews should be chilled after cooking. The fat that forms on top after a few hours in the refrigerator should be skimmed off. When preparing meals, avoid using excess salt. Salt can contribute to raising blood pressure in some people.  EATING AWAY FROM HOME Order entres,  potatoes, and vegetables without sauces or butter. When meat exceeds the size of a deck of cards (3 to 4 ounces), the rest can be taken home for another meal. Choose vegetable or fruit salads and ask for low-calorie salad dressings to be served on the side. Use dressings sparingly. Limit high-fat toppings, such as bacon, crumbled eggs, cheese, sunflower seeds, and olives. Ask for heart-healthy tub margarine instead of butter.

## 2015-10-16 NOTE — Progress Notes (Signed)
Karren BurlyGraciela R Chalmers discharged Home per MD order.  Discharge instructions reviewed and discussed with the patient, all questions and concerns answered. Copy of instructions given to patient.    Medication List    TAKE these medications   albuterol 108 (90 Base) MCG/ACT inhaler Commonly known as:  PROVENTIL HFA;VENTOLIN HFA Inhale 2 puffs into the lungs every 6 (six) hours as needed for wheezing or shortness of breath.   IRON PO Take 2 tablets by mouth daily.   levothyroxine 100 MCG tablet Commonly known as:  SYNTHROID, LEVOTHROID Take 100 mcg by mouth daily before breakfast.   multivitamin with minerals Tabs tablet Take 1 tablet by mouth daily.       Patients skin is clean, dry and intact, no evidence of skin break down. IV site discontinued and catheter remains intact. Site without signs and symptoms of complications. Dressing and pressure applied.  Patient escorted to car by NT in a wheelchair,  no distress noted upon discharge.  Rica KoyanagiBonnie M Natelie Ostrosky 10/16/2015 5:11 PM

## 2015-10-16 NOTE — ED Triage Notes (Signed)
Pt c/o upper abdominal pain that has been intermittent x 1 week; pt states she went to HD last week and was told she has acid reflux; pt states the pain has got to point now that she is unable to carry baby and breastfeed

## 2015-10-16 NOTE — Consult Note (Signed)
Referring Provider: Dr. Shanon Brow  Primary Care Physician:  Pcp Not In System Primary Gastroenterologist:  Dr. Oneida Alar   Date of Admission: 10/16/15 Date of Consultation: 10/16/15  Reason for Consultation:  Abdominal pain, elevated alk phos, dilated CBD   HPI:  Jenny Jackson is a 20 y.o. year old female 2 months post-partum who presented with with worsening RUQ pain, nausea, and vomiting of 1 week duration, found to have elevated transaminases and alk phos. CT abd/pelvis noted prominence of intrahepatic biliary ducts with dilation of CBD to 1.1 cm, concerning for distal obstruction. US abdomen with cholelithiasis but without gallbladder wall thickening or pericholecystic fluid. No evidence of intrahepatic or extrahepatic biliary dilatation. LFTs with some improvement on repeat draw.   Patient states last Monday she had an acute onset of RUQ pain that felt like a pressure, intermittent in nature. Tuesday was nausea onset. Took alka seltzer with only mild relief. Worsening reflux symptoms at this time. Associated vomiting. No fever or chills. No prior episodes. Due to worsening of pain, proceeded to the ED. Infant son is 10 months old. Breastfeeds and supplements with formula. Pain has improved since admission.   Past Medical History:  Diagnosis Date  . Anxiety   . Asthma   . Depression   . Hypothyroidism     Past Surgical History:  Procedure Laterality Date  . CESAREAN SECTION      Prior to Admission medications   Medication Sig Start Date End Date Taking? Authorizing Provider  LEVOTHYROXINE SODIUM PO Take by mouth.   Yes Historical Provider, MD  meclizine (ANTIVERT) 25 MG tablet Take 1 tablet (25 mg total) by mouth 3 (three) times daily as needed for dizziness. 01/01/13   Shawnee Knapp, MD    Current Facility-Administered Medications  Medication Dose Route Frequency Provider Last Rate Last Dose  . 0.9 %  sodium chloride infusion   Intravenous Continuous Rolland Porter, MD      .  HYDROmorphone (DILAUDID) injection 0.5 mg  0.5 mg Intravenous Q2H PRN Phillips Grout, MD      . ondansetron [facility]) tablet 4 mg  4 mg Oral Q6H PRN Phillips Grout, MD       Or  . ondansetron [facility]) injection 4 mg  4 mg Intravenous Q6H PRN Phillips Grout, MD        Allergies as of 10/15/2015  . (No Known Allergies)    Family History  Problem Relation Age of Onset  . Asthma Mother   . Depression Mother   . Hypotension Mother   . Depression Sister   . Asthma Sister   . Seizures Sister   . Cancer Maternal Aunt     Breast, and mouth  . Cancer Maternal Grandfather   . Colon cancer Neg Hx     Social History   Social History  . Marital status: Married    Spouse name: N/A  . Number of children: N/A  . Years of education: N/A   Occupational History  . Not on file.   Social History Main Topics  . Smoking status: Never Smoker  . Smokeless tobacco: Never Used  . Alcohol use No  . Drug use: No  . Sexual activity: Yes    Birth control/ protection: Injection   Other Topics Concern  . Not on file   Social History Narrative  . No narrative on file    Review of Systems: Gen: Denies fever, chills, loss of appetite, change in weight or weight loss CV:  Denies chest pain, heart palpitations, syncope, edema  Resp: Denies shortness of breath with rest, cough, wheezing GI: see HPI  GU : Denies urinary burning, urinary frequency, urinary incontinence.  MS: Denies joint pain,swelling, cramping Derm: Denies rash, itching, dry skin Psych: Denies depression, anxiety,confusion, or memory loss Heme: Denies bruising, bleeding, and enlarged lymph nodes.  Physical Exam: Vital signs in last 24 hours: Temp:  [97.7 F (36.5 C)-98.2 F (36.8 C)] 97.7 F (36.5 C) (08/08 0510) Pulse Rate:  [55-86] 55 (08/08 0510) Resp:  [18-22] 18 (08/08 0510) BP: (128-141)/(71-93) 130/71 (08/08 0510) SpO2:  [98 %-100 %] 100 % (08/08 0510) Weight:  [232 lb (105.2 kg)-238 lb 8.6 oz (108.2 kg)] 238 lb  8.6 oz (108.2 kg) (08/08 0510)   General:   Alert,  Well-developed, well-nourished, pleasant and cooperative in NAD Head:  Normocephalic and atraumatic. Eyes:  Sclera clear, no icterus.   Conjunctiva pink. Ears:  Normal auditory acuity. Nose:  No deformity, discharge,  or lesions. Mouth:  No deformity or lesions, dentition normal. Lungs:  Clear throughout to auscultation.   No wheezes, crackles, or rhonchi. No acute distress. Heart:  Regular rate and rhythm; no murmurs, clicks, rubs,  or gallops. Abdomen:  Soft, mild TTP RUQ and nondistended. No masses, hepatosplenomegaly or hernias noted. Normal bowel sounds, without guarding, and without rebound.   Rectal:  Deferred until time of colonoscopy.   Msk:  Symmetrical without gross deformities. Normal posture. Extremities:  Without edema. Neurologic:  Alert and  oriented x4;  grossly normal neurologically. Psych:  Alert and cooperative. Normal mood and affect.  Intake/Output from previous day: No intake/output data recorded. Intake/Output this shift: No intake/output data recorded.  Lab Results:  Recent Labs  10/16/15 0025 10/16/15 0606  WBC 7.3 5.6  HGB 12.6 11.7*  HCT 39.2 36.4  PLT 285 255   BMET  Recent Labs  10/16/15 0025 10/16/15 0606  NA 136 141  K 3.7 3.7  CL 106 106  CO2 24 25  GLUCOSE 99 94  BUN 10 9  CREATININE 0.63 0.63  CALCIUM 8.9 9.0   LFT  Recent Labs  10/16/15 0025 10/16/15 0606  PROT 8.1 7.1  ALBUMIN 4.5 3.9  AST 207* 172*  ALT 385* 339*  ALKPHOS 221* 193*  BILITOT 1.1 0.9   Lab Results  Component Value Date   LIPASE 31 10/16/2015     Studies/Results: US Abdomen Complete  Result Date: 10/16/2015 CLINICAL DATA:  Acute epigastric abdominal pain, elevated liver function tests. EXAM: ABDOMEN ULTRASOUND COMPLETE COMPARISON:  CT scan of same day. FINDINGS: Gallbladder: Multiple gallstones are noted with the largest measuring 7 mm. No significant gallbladder wall thickening or  pericholecystic fluid is noted. Positive sonographic Murphy's sign is noted. Common bile duct: Diameter: 6 mm which is within normal limits. Liver: No focal lesion identified. Increased echogenicity is noted consistent with fatty infiltration. IVC: No abnormality visualized. Pancreas: Visualized portion unremarkable. Spleen: Size and appearance within normal limits. Right Kidney: Length: 12.5 cm. Echogenicity within normal limits. No mass or hydronephrosis visualized. Left Kidney: Length: 13.3 cm. Echogenicity within normal limits. No mass or hydronephrosis visualized. Abdominal aorta: No aneurysm visualized. Other findings: None. IMPRESSION: Probable fatty infiltration of the liver. Cholelithiasis is noted without gallbladder wall thickening or pericholecystic fluid, but positive sonographic Murphy's sign is noted. If there is clinical concern for acute cholecystitis, HIDA scan may be performed for further evaluation. There is no evidence of intrahepatic or extrahepatic biliary dilatation. Electronically Signed   By: Jeneen Rinks  Murlean Caller, M.D.   On: 10/16/2015 08:16   Ct Abdomen Pelvis W Contrast  Result Date: 10/16/2015 CLINICAL DATA:  Acute onset of intermittent epigastric abdominal pain, nausea and vomiting. Initial encounter. EXAM: CT ABDOMEN AND PELVIS WITH CONTRAST TECHNIQUE: Multidetector CT imaging of the abdomen and pelvis was performed using the standard protocol following bolus administration of intravenous contrast. CONTRAST:  187m ISOVUE-300 IOPAMIDOL (ISOVUE-300) INJECTION 61% COMPARISON:  Pelvic ultrasound performed 03/21/2015 FINDINGS: The visualized lung bases are clear. The spleen is unremarkable in appearance. Minimally increased attenuation within the gallbladder may reflect tiny stones. The gallbladder is mildly distended but otherwise unremarkable. There is prominence of the intrahepatic biliary ducts, with distention of the common bile duct to 1.1 cm, concerning for distal obstruction. The  pancreas and adrenal glands are unremarkable. The kidneys are unremarkable in appearance. There is no evidence of hydronephrosis. No renal or ureteral stones are seen. No perinephric stranding is appreciated. No free fluid is identified. The small bowel is unremarkable in appearance. The stomach is within normal limits. No acute vascular abnormalities are seen. The appendix is normal in caliber, without evidence of appendicitis. The colon is unremarkable in appearance. The bladder is mildly distended and grossly unremarkable. The uterus is unremarkable in appearance. The ovaries are grossly symmetric. No suspicious adnexal masses are seen. No inguinal lymphadenopathy is seen. No acute osseous abnormalities are identified. IMPRESSION: 1. Prominence of the intrahepatic biliary ducts, with distention of the common bile duct to 1.1 cm, concerning for distal obstruction. The patient's LFTs raise question for underlying hepatitis. MRCP or ERCP would be helpful for further evaluation, as deemed clinically appropriate. 2. Question of tiny stones within the gallbladder. The gallbladder is mildly distended but otherwise unremarkable. Electronically Signed   By: JGarald BaldingM.D.   On: 10/16/2015 03:14    Impression: 20year old female admitted with RUQ pain, elevated LFTs, and CBD dilatation on CT with subsequent [place] normal CBD. Concern for likely stone passage. LFTs with some improvement. Lipase normal. Reviewed with Dr. FOneida Alar Proceed with MRI/MRCP to rule out occult stone, microlithiasis remaining. NPO status for now. Will proceed with MRI/MRCP and provide further recommendations thereafter. Discussed with patient who stated understanding.   Plan: NPO MRI/MRCP now Further recommendations to follow   AOrvil Feil ANP-BC REnt [facility] LLCGastroenterology     LOS: 0 days    10/16/2015, 10:03 AM

## 2015-10-16 NOTE — Progress Notes (Signed)
PROGRESS NOTE  Jenny Jackson  CMK:[id number] DOB: 1995-08-15 DOA: 10/16/2015 PCP: Pcp Not In System Outpatient Specialists:  Subjective: Seen with her husband and baby boy at bedside. Denies any abdominal pain or nausea vomiting this morning.  Brief Narrative:  Jenny Jackson is a 20 y.o. female postpartum 2 months s/p csection for OP position and delayed second phase of labor with hypothyroidism diagnosed during pregnancy breast feeding comes in with worsening progressive abdominal pain in the epigastrum for over 6 days.  Pt went to the health dept last week when the pain was very vague and thought it was due to muscle spasms in her back.  Pt progressively got worse and the pain localized in the epigastrum became more frequent and worse postprandial.  She denies any fevers.  No diarrhea.  Still has her gallbladder.  She does not have a headache or peripheral swelling.  She did not have any h/o high blood pressure during her pregnancy.  She did have postpartum hemorrhage requiring 2 units prbc transfusion prior to her discharge.  Her breast feeding has not been interrupted by this illness.  Pt found to have elevated lfts and alk phos and cbd dilation, referred for admission for possible gallstone with obstruction.  Assessment & Plan:   Principal Problem:   Gallstone of bile duct with obstruction Active Problems:   Asthma   GAD (generalized anxiety disorder)   ADHD (attention deficit hyperactivity disorder), inattentive type   Postpartum state   Elevated LFTs   Choledocholithiasis -Presented with RUQ and epigastric abdominal pain, nausea and vomiting as well as elevated LFTs. -CT scan and ultrasound did not show acute cholecystitis but choledocholithiasis. -Seen by GI, recommended MRCP, continue NPO status.  Elevated LFTs -Presented with AST of 207 and ALT of 385 and total bili of 1.1. -MRCP to follow, slightly improved.? Looks like she passed impacted CBD  stone.  Obesity -Counseled about weight loss.  Postpartum state -Delivery about 9 weeks ago.   DVT prophylaxis: Subcutaneous heparin Code Status: Full Code Family Communication:  Disposition Plan:  Diet: Diet NPO time specified Except for: Sips with Meds, Ice Chips  Consultants:   Gastroenterology  Procedures:   None  Antimicrobials:   None  Objective: Vitals:   10/16/15 0010 10/16/15 0355 10/16/15 0510  BP: 141/93 128/90 130/71  Pulse: 75 86 (!) 55  Resp: 22 18 18   Temp: 98.2 F (36.8 C)  97.7 F (36.5 C)  TempSrc:   Oral  SpO2: 98% 98% 100%  Weight: 105.2 kg (232 lb)  108.2 kg (238 lb 8.6 oz)  Height: 5' 1"  (1.549 m)      Intake/Output Summary (Last 24 hours) at 10/16/15 1202 Last data filed at 10/16/15 0930  Gross per 24 hour  Intake                0 ml  Output                0 ml  Net                0 ml   Filed Weights   10/16/15 0010 10/16/15 0510  Weight: 105.2 kg (232 lb) 108.2 kg (238 lb 8.6 oz)    Examination: General exam: Appears calm and comfortable  Respiratory system: Clear to auscultation. Respiratory effort normal. Cardiovascular system: S1 & S2 heard, RRR. No JVD, murmurs, rubs, gallops or clicks. No pedal edema. Gastrointestinal system: Abdomen is nondistended, soft and nontender. No organomegaly or masses felt.  Normal bowel sounds heard. Central nervous system: Alert and oriented. No focal neurological deficits. Extremities: Symmetric 5 x 5 power. Skin: No rashes, lesions or ulcers Psychiatry: Judgement and insight appear normal. Mood & affect appropriate.   Data Reviewed: I have personally reviewed following labs and imaging studies  CBC:  Recent Labs Lab 10/16/15 0025 10/16/15 0606  WBC 7.3 5.6  NEUTROABS 2.7  --   HGB 12.6 11.7*  HCT 39.2 36.4  MCV 86.9 87.1  PLT 285 163   Basic Metabolic Panel:  Recent Labs Lab 10/16/15 0025 10/16/15 0606  NA 136 141  K 3.7 3.7  CL 106 106  CO2 24 25  GLUCOSE 99 94  BUN  10 9  CREATININE 0.63 0.63  CALCIUM 8.9 9.0   GFR: Estimated Creatinine Clearance: 126.4 mL/min (by C-G formula based on SCr of 0.8 mg/dL). Liver Function Tests:  Recent Labs Lab 10/16/15 0025 10/16/15 0606  AST 207* 172*  ALT 385* 339*  ALKPHOS 221* 193*  BILITOT 1.1 0.9  PROT 8.1 7.1  ALBUMIN 4.5 3.9    Recent Labs Lab 10/16/15 0025  LIPASE 31   No results for input(s): AMMONIA in the last 168 hours. Coagulation Profile: No results for input(s): INR, PROTIME in the last 168 hours. Cardiac Enzymes: No results for input(s): CKTOTAL, CKMB, CKMBINDEX, TROPONINI in the last 168 hours. BNP (last 3 results) No results for input(s): PROBNP in the last 8760 hours. HbA1C: No results for input(s): HGBA1C in the last 72 hours. CBG: No results for input(s): GLUCAP in the last 168 hours. Lipid Profile: No results for input(s): CHOL, HDL, LDLCALC, TRIG, CHOLHDL, LDLDIRECT in the last 72 hours. Thyroid Function Tests: No results for input(s): TSH, T4TOTAL, FREET4, T3FREE, THYROIDAB in the last 72 hours. Anemia Panel: No results for input(s): VITAMINB12, FOLATE, FERRITIN, TIBC, IRON, RETICCTPCT in the last 72 hours. Urine analysis:    Component Value Date/Time   COLORURINE AMBER (A) 10/16/2015 0025   APPEARANCEUR CLEAR 10/16/2015 0025   LABSPEC 1.025 10/16/2015 0025   PHURINE 6.5 10/16/2015 0025   GLUCOSEU NEGATIVE 10/16/2015 0025   HGBUR NEGATIVE 10/16/2015 0025   BILIRUBINUR NEGATIVE 10/16/2015 0025   BILIRUBINUR neg 01/01/2013 1853   KETONESUR NEGATIVE 10/16/2015 0025   PROTEINUR NEGATIVE 10/16/2015 0025   UROBILINOGEN 0.2 01/01/2013 1853   UROBILINOGEN 0.2 08/09/2012 1749   NITRITE NEGATIVE 10/16/2015 0025   LEUKOCYTESUR TRACE (A) 10/16/2015 0025   Sepsis Labs: @LABRCNTIP (procalcitonin:4,lacticidven:4)  )No results found for this or any previous visit (from the past 240 hour(s)).   Invalid input(s): PROCALCITONIN, LACTICACIDVEN   Radiology Studies: US Abdomen  Complete  Result Date: 10/16/2015 CLINICAL DATA:  Acute epigastric abdominal pain, elevated liver function tests. EXAM: ABDOMEN ULTRASOUND COMPLETE COMPARISON:  CT scan of same day. FINDINGS: Gallbladder: Multiple gallstones are noted with the largest measuring 7 mm. No significant gallbladder wall thickening or pericholecystic fluid is noted. Positive sonographic Murphy's sign is noted. Common bile duct: Diameter: 6 mm which is within normal limits. Liver: No focal lesion identified. Increased echogenicity is noted consistent with fatty infiltration. IVC: No abnormality visualized. Pancreas: Visualized portion unremarkable. Spleen: Size and appearance within normal limits. Right Kidney: Length: 12.5 cm. Echogenicity within normal limits. No mass or hydronephrosis visualized. Left Kidney: Length: 13.3 cm. Echogenicity within normal limits. No mass or hydronephrosis visualized. Abdominal aorta: No aneurysm visualized. Other findings: None. IMPRESSION: Probable fatty infiltration of the liver. Cholelithiasis is noted without gallbladder wall thickening or pericholecystic fluid, but positive sonographic Murphy's sign  is noted. If there is clinical concern for acute cholecystitis, HIDA scan may be performed for further evaluation. There is no evidence of intrahepatic or extrahepatic biliary dilatation. Electronically Signed   By: Marijo Conception, M.D.   On: 10/16/2015 08:16   Ct Abdomen Pelvis W Contrast  Result Date: 10/16/2015 CLINICAL DATA:  Acute onset of intermittent epigastric abdominal pain, nausea and vomiting. Initial encounter. EXAM: CT ABDOMEN AND PELVIS WITH CONTRAST TECHNIQUE: Multidetector CT imaging of the abdomen and pelvis was performed using the standard protocol following bolus administration of intravenous contrast. CONTRAST:  160m ISOVUE-300 IOPAMIDOL (ISOVUE-300) INJECTION 61% COMPARISON:  Pelvic ultrasound performed 03/21/2015 FINDINGS: The visualized lung bases are clear. The spleen is  unremarkable in appearance. Minimally increased attenuation within the gallbladder may reflect tiny stones. The gallbladder is mildly distended but otherwise unremarkable. There is prominence of the intrahepatic biliary ducts, with distention of the common bile duct to 1.1 cm, concerning for distal obstruction. The pancreas and adrenal glands are unremarkable. The kidneys are unremarkable in appearance. There is no evidence of hydronephrosis. No renal or ureteral stones are seen. No perinephric stranding is appreciated. No free fluid is identified. The small bowel is unremarkable in appearance. The stomach is within normal limits. No acute vascular abnormalities are seen. The appendix is normal in caliber, without evidence of appendicitis. The colon is unremarkable in appearance. The bladder is mildly distended and grossly unremarkable. The uterus is unremarkable in appearance. The ovaries are grossly symmetric. No suspicious adnexal masses are seen. No inguinal lymphadenopathy is seen. No acute osseous abnormalities are identified. IMPRESSION: 1. Prominence of the intrahepatic biliary ducts, with distention of the common bile duct to 1.1 cm, concerning for distal obstruction. The patient's LFTs raise question for underlying hepatitis. MRCP or ERCP would be helpful for further evaluation, as deemed clinically appropriate. 2. Question of tiny stones within the gallbladder. The gallbladder is mildly distended but otherwise unremarkable. Electronically Signed   By: JGarald BaldingM.D.   On: 10/16/2015 03:14        Scheduled Meds:  Continuous Infusions: . sodium chloride       LOS: 0 days    Time spent: 35 minutes    Josaphine Shimamoto A, MD [facility] Pager [phone] If 7PM-7AM, please contact night-coverage [email or web address] Password [email or web address] 10/16/2015, 12:02 PM

## 2015-10-16 NOTE — Discharge Summary (Signed)
Physician Discharge Summary  Jenny Jackson:[id number] DOB: 06/01/1995 DOA: 10/16/2015  PCP: Pcp Not In System  Admit date: 10/16/2015 Discharge date: 10/16/2015  Admitted From: Home Disposition:Home  Recommendations for Outpatient Follow-up:  1. Follow up with PCP in 1-2 weeks, refer to gen surgery for consideration of cholecystectomy.  2. Please obtain CMP on Aug 15  Home Health: No Equipment/Devices: NA  Discharge Condition: No CODE STATUS: Full Diet recommendation: Regular diet  Brief/Interim Summary: Jenny Pasqual Martinezis a 20 y.o.femalepostpartum 2 months s/p csection for OP position and delayed second phase of labor with hypothyroidism diagnosed during pregnancy breast feeding comes in with worsening progressive abdominal pain in the epigastrum for over 6 days. Pt went to the health dept last week when the pain was very vague and thought it was due to muscle spasms in her back. Pt progressively got worse and the pain localized in the epigastrum became more frequent and worse postprandial. She denies any fevers. No diarrhea. Still has her gallbladder. She does not have a headache or peripheral swelling. She did not have any h/o high blood pressure during her pregnancy. She did have postpartum hemorrhage requiring 2 units prbc transfusion prior to her discharge. Her breast feeding has not been interrupted by this illness. Pt found to have elevated lfts and alk phos and cbd dilation, referred for admission for possible gallstone with obstruction.  Discharge Diagnoses:  Principal Problem:   Gallstone of bile duct with obstruction Active Problems:   Asthma   GAD (generalized anxiety disorder)   ADHD (attention deficit hyperactivity disorder), inattentive type   Postpartum state   Elevated LFTs  Cholelithiasis and Choledocholithiasis -Presented with RUQ and epigastric abdominal pain, nausea and vomiting as well as elevated LFTs. -CT scan and ultrasound did not show  acute cholecystitis but choledocholithiasis. -Seen by GI, recommended MRCP and showed no CBD stones, GI recommended discharge home. -Follow with Gen surgery as outpatient for consideration of cholecystectomy.  Elevated LFTs -Presented with AST of 207 and ALT of 385 and total bili of 1.1. -MRCP to follow, slightly improved.? Looks like she passed impacted CBD stone.  Obesity -Counseled about weight loss.  Postpartum state -Delivery about 9 weeks ago.   Discharge Instructions  Discharge Instructions    Increase activity slowly    Complete by:  As directed       Medication List    TAKE these medications   albuterol 108 (90 Base) MCG/ACT inhaler Commonly known as:  PROVENTIL HFA;VENTOLIN HFA Inhale 2 puffs into the lungs every 6 (six) hours as needed for wheezing or shortness of breath.   IRON PO Take 2 tablets by mouth daily.   levothyroxine 100 MCG tablet Commonly known as:  SYNTHROID, LEVOTHROID Take 100 mcg by mouth daily before breakfast.   multivitamin with minerals Tabs tablet Take 1 tablet by mouth daily.       No Known Allergies  Consultations:  GI, Dr. Oneida Alar   Procedures/Studies: US Abdomen Complete  Result Date: 10/16/2015 CLINICAL DATA:  Acute epigastric abdominal pain, elevated liver function tests. EXAM: ABDOMEN ULTRASOUND COMPLETE COMPARISON:  CT scan of same day. FINDINGS: Gallbladder: Multiple gallstones are noted with the largest measuring 7 mm. No significant gallbladder wall thickening or pericholecystic fluid is noted. Positive sonographic Murphy's sign is noted. Common bile duct: Diameter: 6 mm which is within normal limits. Liver: No focal lesion identified. Increased echogenicity is noted consistent with fatty infiltration. IVC: No abnormality visualized. Pancreas: Visualized portion unremarkable. Spleen: Size and appearance within  normal limits. Right Kidney: Length: 12.5 cm. Echogenicity within normal limits. No mass or hydronephrosis  visualized. Left Kidney: Length: 13.3 cm. Echogenicity within normal limits. No mass or hydronephrosis visualized. Abdominal aorta: No aneurysm visualized. Other findings: None. IMPRESSION: Probable fatty infiltration of the liver. Cholelithiasis is noted without gallbladder wall thickening or pericholecystic fluid, but positive sonographic Murphy's sign is noted. If there is clinical concern for acute cholecystitis, HIDA scan may be performed for further evaluation. There is no evidence of intrahepatic or extrahepatic biliary dilatation. Electronically Signed   By: Marijo Conception, M.D.   On: 10/16/2015 08:16   Ct Abdomen Pelvis W Contrast  Result Date: 10/16/2015 CLINICAL DATA:  Acute onset of intermittent epigastric abdominal pain, nausea and vomiting. Initial encounter. EXAM: CT ABDOMEN AND PELVIS WITH CONTRAST TECHNIQUE: Multidetector CT imaging of the abdomen and pelvis was performed using the standard protocol following bolus administration of intravenous contrast. CONTRAST:  177m ISOVUE-300 IOPAMIDOL (ISOVUE-300) INJECTION 61% COMPARISON:  Pelvic ultrasound performed 03/21/2015 FINDINGS: The visualized lung bases are clear. The spleen is unremarkable in appearance. Minimally increased attenuation within the gallbladder may reflect tiny stones. The gallbladder is mildly distended but otherwise unremarkable. There is prominence of the intrahepatic biliary ducts, with distention of the common bile duct to 1.1 cm, concerning for distal obstruction. The pancreas and adrenal glands are unremarkable. The kidneys are unremarkable in appearance. There is no evidence of hydronephrosis. No renal or ureteral stones are seen. No perinephric stranding is appreciated. No free fluid is identified. The small bowel is unremarkable in appearance. The stomach is within normal limits. No acute vascular abnormalities are seen. The appendix is normal in caliber, without evidence of appendicitis. The colon is unremarkable in  appearance. The bladder is mildly distended and grossly unremarkable. The uterus is unremarkable in appearance. The ovaries are grossly symmetric. No suspicious adnexal masses are seen. No inguinal lymphadenopathy is seen. No acute osseous abnormalities are identified. IMPRESSION: 1. Prominence of the intrahepatic biliary ducts, with distention of the common bile duct to 1.1 cm, concerning for distal obstruction. The patient's LFTs raise question for underlying hepatitis. MRCP or ERCP would be helpful for further evaluation, as deemed clinically appropriate. 2. Question of tiny stones within the gallbladder. The gallbladder is mildly distended but otherwise unremarkable. Electronically Signed   By: JGarald BaldingM.D.   On: 10/16/2015 03:14   Mr Abdomen Mrcp Wo Cm  Result Date: 10/16/2015 CLINICAL DATA:  20year old female with upper abdominal pain, transaminitis, cholelithiasis and dilated common bile duct on CT study from earlier today. EXAM: MRI ABDOMEN WITHOUT CONTRAST  (INCLUDING MRCP) TECHNIQUE: Multiplanar multisequence MR imaging of the abdomen was performed. Heavily T2-weighted images of the biliary and pancreatic ducts were obtained, and three-dimensional MRCP images were rendered by post processing. The examination was discontinued prior to completion at the patient's request due to claustrophobia. COMPARISON:  10/16/2015 CT abdomen/ pelvis and abdominal sonogram. FINDINGS: Several of the sequences are motion degraded. Lower chest: Clear lung bases. Hepatobiliary: Normal liver size and configuration. No hepatic steatosis. No liver mass. Mildly distended gallbladder contains innumerable layering tiny gallstones measuring up to 5 mm in size. No definite gallbladder wall thickening. No pericholecystic fluid. No intrahepatic biliary ductal dilatation. Common bile duct diameter 6 mm, upper normal. No definite choledocholithiasis on the limited motion degraded MRCP sequence. No gross biliary or ampullary  mass. Pancreas: No pancreatic mass or duct dilation. No evidence of pancreas divisum. No peripancreatic edema. Spleen: Normal size. No mass. Adrenals/Urinary Tract:  Normal adrenals. No hydronephrosis. Normal kidneys with no renal mass. Stomach/Bowel: Grossly normal stomach. Visualized small and large bowel is normal caliber, with no bowel wall thickening. Vascular/Lymphatic: Normal caliber abdominal aorta. No pathologically enlarged lymph nodes in the abdomen. Other: No abdominal ascites or focal fluid collection. Musculoskeletal: No aggressive appearing focal osseous lesions. IMPRESSION: 1. Limited motion degraded incomplete MRI abdomen/MRCP, which was discontinued early at the patient's request due to claustrophobia. 2. Cholelithiasis. Mildly distended gallbladder. No definite gallbladder wall thickening. No pericholecystic fluid. MRI findings are nonspecific and inconclusive for acute cholecystitis. Clinical correlation is necessary. Hepatobiliary scintigraphy may be useful for further assessment as clinically warranted. 3. No intrahepatic biliary ductal dilatation. Common bile duct diameter 6 mm, upper normal. No appreciable choledocholithiasis. Electronically Signed   By: Ilona Sorrel M.D.   On: 10/16/2015 12:24   Mr 3d Recon At Scanner  Result Date: 10/16/2015 CLINICAL DATA:  20 year old female with upper abdominal pain, transaminitis, cholelithiasis and dilated common bile duct on CT study from earlier today. EXAM: MRI ABDOMEN WITHOUT CONTRAST  (INCLUDING MRCP) TECHNIQUE: Multiplanar multisequence MR imaging of the abdomen was performed. Heavily T2-weighted images of the biliary and pancreatic ducts were obtained, and three-dimensional MRCP images were rendered by post processing. The examination was discontinued prior to completion at the patient's request due to claustrophobia. COMPARISON:  10/16/2015 CT abdomen/ pelvis and abdominal sonogram. FINDINGS: Several of the sequences are motion degraded. Lower  chest: Clear lung bases. Hepatobiliary: Normal liver size and configuration. No hepatic steatosis. No liver mass. Mildly distended gallbladder contains innumerable layering tiny gallstones measuring up to 5 mm in size. No definite gallbladder wall thickening. No pericholecystic fluid. No intrahepatic biliary ductal dilatation. Common bile duct diameter 6 mm, upper normal. No definite choledocholithiasis on the limited motion degraded MRCP sequence. No gross biliary or ampullary mass. Pancreas: No pancreatic mass or duct dilation. No evidence of pancreas divisum. No peripancreatic edema. Spleen: Normal size. No mass. Adrenals/Urinary Tract: Normal adrenals. No hydronephrosis. Normal kidneys with no renal mass. Stomach/Bowel: Grossly normal stomach. Visualized small and large bowel is normal caliber, with no bowel wall thickening. Vascular/Lymphatic: Normal caliber abdominal aorta. No pathologically enlarged lymph nodes in the abdomen. Other: No abdominal ascites or focal fluid collection. Musculoskeletal: No aggressive appearing focal osseous lesions. IMPRESSION: 1. Limited motion degraded incomplete MRI abdomen/MRCP, which was discontinued early at the patient's request due to claustrophobia. 2. Cholelithiasis. Mildly distended gallbladder. No definite gallbladder wall thickening. No pericholecystic fluid. MRI findings are nonspecific and inconclusive for acute cholecystitis. Clinical correlation is necessary. Hepatobiliary scintigraphy may be useful for further assessment as clinically warranted. 3. No intrahepatic biliary ductal dilatation. Common bile duct diameter 6 mm, upper normal. No appreciable choledocholithiasis. Electronically Signed   By: Ilona Sorrel M.D.   On: 10/16/2015 12:24    (Echo, Carotid, EGD, Colonoscopy, ERCP)    Subjective:   Discharge Exam: Vitals:   10/16/15 0510 10/16/15 1355  BP: 130/71 115/84  Pulse: (!) 55 66  Resp: 18 20  Temp: 97.7 F (36.5 C) 98.6 F (37 C)    Vitals:   10/16/15 0010 10/16/15 0355 10/16/15 0510 10/16/15 1355  BP: 141/93 128/90 130/71 115/84  Pulse: 75 86 (!) 55 66  Resp: 22 18 18 20   Temp: 98.2 F (36.8 C)  97.7 F (36.5 C) 98.6 F (37 C)  TempSrc:   Oral Oral  SpO2: 98% 98% 100% 99%  Weight: 105.2 kg (232 lb)  108.2 kg (238 lb 8.6 oz)   Height:  5' 1"  (1.549 m)       General: Pt is alert, awake, not in acute distress Cardiovascular: RRR, S1/S2 +, no rubs, no gallops Respiratory: CTA bilaterally, no wheezing, no rhonchi Abdominal: Soft, NT, ND, bowel sounds + Extremities: no edema, no cyanosis    The results of significant diagnostics from this hospitalization (including imaging, microbiology, ancillary and laboratory) are listed below for reference.     Microbiology: No results found for this or any previous visit (from the past 240 hour(s)).   Labs: BNP (last 3 results) No results for input(s): BNP in the last 8760 hours. Basic Metabolic Panel:  Recent Labs Lab 10/16/15 0025 10/16/15 0606  NA 136 141  K 3.7 3.7  CL 106 106  CO2 24 25  GLUCOSE 99 94  BUN 10 9  CREATININE 0.63 0.63  CALCIUM 8.9 9.0   Liver Function Tests:  Recent Labs Lab 10/16/15 0025 10/16/15 0606  AST 207* 172*  ALT 385* 339*  ALKPHOS 221* 193*  BILITOT 1.1 0.9  PROT 8.1 7.1  ALBUMIN 4.5 3.9    Recent Labs Lab 10/16/15 0025  LIPASE 31   No results for input(s): AMMONIA in the last 168 hours. CBC:  Recent Labs Lab 10/16/15 0025 10/16/15 0606  WBC 7.3 5.6  NEUTROABS 2.7  --   HGB 12.6 11.7*  HCT 39.2 36.4  MCV 86.9 87.1  PLT 285 255   Cardiac Enzymes: No results for input(s): CKTOTAL, CKMB, CKMBINDEX, TROPONINI in the last 168 hours. BNP: Invalid input(s): POCBNP CBG: No results for input(s): GLUCAP in the last 168 hours. D-Dimer No results for input(s): DDIMER in the last 72 hours. Hgb A1c No results for input(s): HGBA1C in the last 72 hours. Lipid Profile No results for input(s): CHOL, HDL,  LDLCALC, TRIG, CHOLHDL, LDLDIRECT in the last 72 hours. Thyroid function studies No results for input(s): TSH, T4TOTAL, T3FREE, THYROIDAB in the last 72 hours.  Invalid input(s): FREET3 Anemia work up No results for input(s): VITAMINB12, FOLATE, FERRITIN, TIBC, IRON, RETICCTPCT in the last 72 hours. Urinalysis    Component Value Date/Time   COLORURINE AMBER (A) 10/16/2015 0025   APPEARANCEUR CLEAR 10/16/2015 0025   LABSPEC 1.025 10/16/2015 0025   PHURINE 6.5 10/16/2015 0025   GLUCOSEU NEGATIVE 10/16/2015 0025   HGBUR NEGATIVE 10/16/2015 0025   BILIRUBINUR NEGATIVE 10/16/2015 0025   BILIRUBINUR neg 01/01/2013 1853   KETONESUR NEGATIVE 10/16/2015 0025   PROTEINUR NEGATIVE 10/16/2015 0025   UROBILINOGEN 0.2 01/01/2013 1853   UROBILINOGEN 0.2 08/09/2012 1749   NITRITE NEGATIVE 10/16/2015 0025   LEUKOCYTESUR TRACE (A) 10/16/2015 0025   Sepsis Labs Invalid input(s): PROCALCITONIN,  WBC,  LACTICIDVEN Microbiology No results found for this or any previous visit (from the past 240 hour(s)).   Time coordinating discharge: Over 30 minutes  SIGNED:   Birdie Hopes, MD  [facility] 10/16/2015, 3:41 PM Pager   If 7PM-7AM, please contact night-coverage [email or web address] Password [email or web address]

## 2015-10-16 NOTE — Telephone Encounter (Signed)
HAVE LIVER PANEL CHECKED NEXT Tuesday AUG 15 . FAX ORDER TO LAB. SEE GENERAL SURGERY IN 2-4 WEEKS, DX: BILIARY COLIC, EVALUATE FOR CHOLECYSTECTOMY.

## 2015-10-16 NOTE — Care Management Note (Addendum)
Case Management Note  Patient Details  Name: Jenny Jackson MRN: [id number] Date of Birth: [birth year]   Additional Comments: Patient DC today with self care. Sp[facility] with [facility] regarding [facility]  [facility]. Patient will call to establish care with Dr. Selena BattenKim. Dr. Elmyra RicksKim's number added to AVS for patient reference. Patient to follow up with surgeon post discharge.  Niemah Schwebke, Chrystine OilerSharley Diane, RN 10/16/2015, 3:51 PM

## 2015-10-18 ENCOUNTER — Other Ambulatory Visit: Payer: Self-pay

## 2015-10-18 DIAGNOSIS — K831 Obstruction of bile duct: Secondary | ICD-10-CM

## 2015-10-18 NOTE — Telephone Encounter (Signed)
Referral has been done and appointment on 10/31/15 @ 2:45

## 2015-10-23 ENCOUNTER — Other Ambulatory Visit (HOSPITAL_COMMUNITY)
Admission: RE | Admit: 2015-10-23 | Discharge: 2015-10-23 | Disposition: A | Discharge status: Home / Self Care | Payer: Medicaid Other | Source: Ambulatory Visit | Attending: Gastroenterology | Admitting: Gastroenterology

## 2015-10-23 ENCOUNTER — Telehealth: Payer: Self-pay | Admitting: Gastroenterology

## 2015-10-23 DIAGNOSIS — R748 Abnormal levels of other serum enzymes: Secondary | ICD-10-CM | POA: Insufficient documentation

## 2015-10-23 LAB — HEPATIC FUNCTION PANEL
ALK PHOS: 123 U/L (ref 38–126)
ALT: 88 U/L — AB (ref 14–54)
AST: 36 U/L (ref 15–41)
Albumin: 4.9 g/dL (ref 3.5–5.0)
BILIRUBIN DIRECT: 0.1 mg/dL (ref 0.1–0.5)
BILIRUBIN INDIRECT: 0.4 mg/dL (ref 0.3–0.9)
BILIRUBIN TOTAL: 0.5 mg/dL (ref 0.3–1.2)
Total Protein: 8.9 g/dL — ABNORMAL HIGH (ref 6.5–8.1)

## 2015-10-23 NOTE — Telephone Encounter (Signed)
Hepatic Function Latest Ref Rng & Units 10/23/2015 10/16/2015 10/16/2015  Total Protein 6.5 - 8.1 g/dL 8.9(H) 7.1 8.1  Albumin 3.5 - 5.0 g/dL 4.9 3.9 4.5  AST 15 - 41 U/L 36 172(H) 207(H)  ALT 14 - 54 U/L 88(H) 339(H) 385(H)  Alk Phosphatase 38 - 126 U/L 123 193(H) 221(H)  Total Bilirubin 0.3 - 1.2 mg/dL 0.5 0.9 1.1  Bilirubin, Direct 0.1 - 0.5 mg/dL 0.1 - -   PLEASE CALL PT. HER LIVER TEST ARE IMPROVING. THEY ARE NEAR [place];. SHE SHOULD SEE GENERAL SURGERY TO BE EVALUATED FOR CHOLECYSTECTOMY

## 2015-10-23 NOTE — Telephone Encounter (Signed)
Lab faxed to Dr Lovell SheehanJenkins' office

## 2015-10-23 NOTE — Telephone Encounter (Signed)
Pt is aware and ok to refer to surgeon.

## 2015-11-01 ENCOUNTER — Encounter (HOSPITAL_COMMUNITY): Payer: Self-pay

## 2015-11-01 ENCOUNTER — Encounter (HOSPITAL_COMMUNITY)
Admission: RE | Admit: 2015-11-01 | Discharge: 2015-11-01 | Disposition: A | Discharge status: Home / Self Care | Payer: Medicaid Other | Source: Ambulatory Visit | Attending: Surgery | Admitting: Surgery

## 2015-11-01 DIAGNOSIS — Z01812 Encounter for preprocedural laboratory examination: Secondary | ICD-10-CM | POA: Diagnosis present

## 2015-11-01 LAB — CBC
HCT: 37.3 % (ref 36.0–46.0)
HEMOGLOBIN: 12.3 g/dL (ref 12.0–15.0)
MCH: 28.1 pg (ref 26.0–34.0)
MCHC: 33 g/dL (ref 30.0–36.0)
MCV: 85.4 fL (ref 78.0–100.0)
Platelets: 362 10*3/uL (ref 150–400)
RBC: 4.37 MIL/uL (ref 3.87–5.11)
RDW: 13.8 % (ref 11.5–15.5)
WBC: 7.3 10*3/uL (ref 4.0–10.5)

## 2015-11-01 LAB — COMPREHENSIVE METABOLIC PANEL
ALBUMIN: 4.7 g/dL (ref 3.5–5.0)
ALK PHOS: 96 U/L (ref 38–126)
ALT: 31 U/L (ref 14–54)
ANION GAP: 6 (ref 5–15)
AST: 24 U/L (ref 15–41)
BUN: 12 mg/dL (ref 6–20)
CALCIUM: 9.2 mg/dL (ref 8.9–10.3)
CHLORIDE: 107 mmol/L (ref 101–111)
CO2: 24 mmol/L (ref 22–32)
Creatinine, Ser: 0.57 mg/dL (ref 0.44–1.00)
GFR calc Af Amer: >60 mL/min (ref >60)
GFR calc non Af Amer: >60 mL/min (ref >60)
GLUCOSE: 86 mg/dL (ref 65–99)
Potassium: 4.2 mmol/L (ref 3.5–5.1)
SODIUM: 137 mmol/L (ref 135–145)
Total Bilirubin: 0.3 mg/dL (ref 0.3–1.2)
Total Protein: 7.8 g/dL (ref 6.5–8.1)

## 2015-11-01 LAB — HCG, SERUM, QUALITATIVE: Preg, Serum: NEGATIVE

## 2015-11-01 NOTE — Patient Instructions (Signed)
Jenny Jackson  11/01/2015     @PREFPERIOPPHARMACY @   Your procedure is scheduled on 11/05/2015.  [facility]  Call this number if you have problems the morning of surgery:  [phone]   Remember:  Do not eat food or drink liquids after midnight.  Take these medicines the morning of surgery with A SIP OF WATER Albuterol inhaler and bring with you, Synthroid   Do not wear jewelry, make-up or nail polish.  Do not wear lotions, powders, or perfumes, or deoderant.  Do not shave 48 hours prior to surgery.  Men may shave face and neck.  Do not bring valuables to the hospital.  [facility] not [facility] for any belongings or valuables.  Contacts, dentures or bridgework may not be worn into surgery.  Leave your suitcase in the car.  After surgery it may be brought to your room.  For patients admitted to the hospital, discharge time will be determined by your treatment team.  Patients discharged the day of surgery will not be allowed to drive home.   Please read over the following fact sheets that you were given. Surgical Site Infection Prevention and Anesthesia Post-op Instructions     PATIENT INSTRUCTIONS POST-ANESTHESIA  IMMEDIATELY FOLLOWING SURGERY:  Do not drive or operate machinery for the first twenty four hours after surgery.  Do not make any important decisions for twenty four hours after surgery or while taking narcotic pain medications or sedatives.  If you develop intractable nausea and vomiting or a severe headache please notify your doctor immediately.  FOLLOW-UP:  Please make an appointment with your surgeon as instructed. You do not need to follow up with anesthesia unless specifically instructed to do so.  WOUND CARE INSTRUCTIONS (if applicable):  Keep a dry clean dressing on the anesthesia/puncture wound site if there is drainage.  Once the wound has quit draining you may leave it open to air.  Generally you should leave the bandage  intact for twenty four hours unless there is drainage.  If the epidural site drains for more than 36-48 hours please call the anesthesia department.  QUESTIONS?:  Please feel free to call your physician or the hospital operator if you have any questions, and they will be happy to assist you.      Laparoscopic Cholecystectomy Laparoscopic cholecystectomy is surgery to remove the gallbladder. The gallbladder is located in the upper right part of the abdomen, behind the liver. It is a storage sac for bile, which is produced in the liver. Bile aids in the digestion and absorption of fats. Cholecystectomy is often done for inflammation of the gallbladder (cholecystitis). This condition is usually caused by a buildup of gallstones (cholelithiasis) in the gallbladder. Gallstones can block the flow of bile, and that can result in inflammation and pain. In severe cases, emergency surgery may be required. If emergency surgery is not required, you will have time to prepare for the procedure. Laparoscopic surgery is an alternative to open surgery. Laparoscopic surgery has a shorter recovery time. Your common bile duct may also need to be examined during the procedure. If stones are found in the common bile duct, they may be removed. LET [facility] KNOW ABOUT:  Any allergies you have.  All medicines you are taking, including vitamins, herbs, eye drops, creams, and over-the-counter medicines.  Previous problems you or members of your family have had with the use of anesthetics.  Any blood disorders you have.  Previous surgeries you have had.  Any medical conditions you have. RISKS AND COMPLICATIONS Generally, this is a safe procedure. However, problems may occur, including:  Infection.  Bleeding.  Allergic reactions to medicines.  Damage to other structures or organs.  A stone remaining in the common bile duct.  A bile leak from the cyst duct that is clipped when your gallbladder  is removed.  The need to convert to open surgery, which requires a larger incision in the abdomen. This may be necessary if your surgeon thinks that it is not safe to continue with a laparoscopic procedure. BEFORE THE PROCEDURE  Ask your health care provider about:  Changing or stopping your regular medicines. This is especially important if you are taking diabetes medicines or blood thinners.  Taking medicines such as aspirin and ibuprofen. These medicines can thin your blood. Do not take these medicines before your procedure if your health care provider instructs you not to.  Follow instructions from your health care provider about eating or drinking restrictions.  Let your health care provider know if you develop a cold or an infection before surgery.  Plan to have someone take you home after the procedure.  Ask your health care provider how your surgical site will be marked or identified.  You may be given antibiotic medicine to help prevent infection. PROCEDURE  To reduce your risk of infection:  Your health care team will wash or sanitize their hands.  Your skin will be washed with soap.  An IV tube may be inserted into one of your veins.  You will be given a medicine to make you fall asleep (general anesthetic).  A breathing tube will be placed in your mouth.  The surgeon will make several small cuts (incisions) in your abdomen.  A thin, lighted tube (laparoscope) that has a tiny camera on the end will be inserted through one of the small incisions. The camera on the laparoscope will send a picture to a TV screen (monitor) in the operating room. This will give the surgeon a good view inside your abdomen.  A gas will be pumped into your abdomen. This will expand your abdomen to give the surgeon more room to perform the surgery.  Other tools that are needed for the procedure will be inserted through the other incisions. The gallbladder will be removed through one of the  incisions.  After your gallbladder has been removed, the incisions will be closed with stitches (sutures), staples, or skin glue.  Your incisions may be covered with a bandage (dressing). The procedure may vary among health care providers and hospitals. AFTER THE PROCEDURE  Your blood pressure, heart rate, breathing rate, and blood oxygen level will be monitored often until the medicines you were given have worn off.  You will be given medicines as needed to control your pain.   This information is not intended to replace advice given to you by your health care provider. Make sure you discuss any questions you have with your health care provider.   Document Released: 02/24/2005 Document Revised: 11/15/2014 Document Reviewed: 10/06/2012 Elsevier Interactive Patient Education [employer].

## 2015-11-02 ENCOUNTER — Emergency Department (HOSPITAL_COMMUNITY)
Admission: EM | Admit: 2015-11-02 | Discharge: 2015-11-03 | Disposition: A | Discharge status: Home / Self Care | Payer: Medicaid Other | Attending: Emergency Medicine | Admitting: Emergency Medicine

## 2015-11-02 ENCOUNTER — Encounter (HOSPITAL_COMMUNITY): Payer: Self-pay

## 2015-11-02 DIAGNOSIS — J45909 Unspecified asthma, uncomplicated: Secondary | ICD-10-CM | POA: Insufficient documentation

## 2015-11-02 DIAGNOSIS — R1013 Epigastric pain: Secondary | ICD-10-CM

## 2015-11-02 DIAGNOSIS — F909 Attention-deficit hyperactivity disorder, unspecified type: Secondary | ICD-10-CM | POA: Insufficient documentation

## 2015-11-02 DIAGNOSIS — E039 Hypothyroidism, unspecified: Secondary | ICD-10-CM | POA: Diagnosis not present

## 2015-11-02 DIAGNOSIS — K8020 Calculus of gallbladder without cholecystitis without obstruction: Secondary | ICD-10-CM | POA: Insufficient documentation

## 2015-11-02 NOTE — ED Provider Notes (Signed)
[facility]-EMERGENCY DEPT Provider Note   CSN: [id number] Arrival date & time: 11/02/15  2309  By signing my name below, I, Jenny Jackson, attest that this documentation has been prepared under the direction and in the presence of Nelva Nay, MD. Electronically Signed: Doreatha Jackson, ED Scribe. 11/03/15. 12:01 AM.     History   Chief Complaint Chief Complaint  Patient presents with  . Abdominal Pain    HPI Jenny Jackson is a 20 y.o. female with h/o gallstone of bile duct with obstruction who presents to the Emergency Department complaining of moderate epigastric abdominal pain with radiation to the back onset 2 hours ago with associated nausea and one episode of emesis. She reports her pain was worsened with breathing with initial onset, but has currently improved. Pt states she ate baked tilapia and a cup of 1% milk for dinner. Pt is scheduled for surgery with Dr. Earlene Plater in 3 days for treatment of her gallstones. She denies fever.   The history is provided by the patient. No language interpreter was used.    Past Medical History:  Diagnosis Date  . Anxiety   . Asthma   . Depression   . Hypothyroidism     Patient Active Problem List   Diagnosis Date Noted  . Gallstone of bile duct with obstruction 10/16/2015  . Postpartum state 10/16/2015  . Gallstones, common bile duct   . Elevated LFTs   . MDD (major depressive disorder), recurrent episode, severe (HCC) 08/09/2012  . GAD (generalized anxiety disorder) 08/09/2012  . ADHD (attention deficit hyperactivity disorder), inattentive type 08/09/2012  . Asthma 04/22/2010  . FRACTURE, RIGHT GREAT TOE 04/22/2010    Past Surgical History:  Procedure Laterality Date  . CESAREAN SECTION      OB History    No data available       Home Medications    Prior to Admission medications   Medication Sig Start Date End Date Taking? Authorizing Provider  albuterol (PROVENTIL HFA;VENTOLIN HFA) 108 (90 Base) MCG/ACT inhaler Inhale 2  puffs into the lungs every 6 (six) hours as needed for wheezing or shortness of breath.    Historical Provider, MD  IRON PO Take 2 tablets by mouth daily.    Historical Provider, MD  levothyroxine (SYNTHROID, LEVOTHROID) 100 MCG tablet Take 100 mcg by mouth daily before breakfast.    Historical Provider, MD  Multiple Vitamin (MULTIVITAMIN WITH MINERALS) TABS tablet Take 1 tablet by mouth daily.    Historical Provider, MD  ondansetron (ZOFRAN) 4 MG tablet Take 1 tablet (4 mg total) by mouth every 6 (six) hours. 11/03/15   Nelva Nay, MD  oxyCODONE-acetaminophen (PERCOCET/ROXICET) 5-325 MG tablet Take 2 tablets by mouth every 4 (four) hours as needed for severe pain. 11/03/15   Nelva Nay, MD    Family History Family History  Problem Relation Age of Onset  . Asthma Mother   . Depression Mother   . Hypotension Mother   . Depression Sister   . Asthma Sister   . Seizures Sister   . Cancer Maternal Aunt     Breast, and mouth  . Cancer Maternal Grandfather   . Colon cancer Neg Hx     Social History Social History  Substance Use Topics  . Smoking status: Never Smoker  . Smokeless tobacco: Never Used  . Alcohol use No     Allergies   Review of patient's allergies indicates no known allergies.   Review of Systems Review of Systems  Constitutional: Negative for  fever.  Gastrointestinal: Positive for abdominal pain, nausea and vomiting.  All other systems reviewed and are negative.    Physical Exam Updated Vital Signs BP 116/75 (BP Location: Left Arm)   Pulse 113   Temp 98.1 F (36.7 C)   Resp 22   Ht 5\' 1"  (1.549 m)   Wt 224 lb (101.6 kg)   SpO2 98%   BMI 42.32 kg/m   Physical Exam  Constitutional: She is oriented to person, place, and time. She appears well-developed and well-nourished. No distress.  HENT:  Head: Normocephalic and atraumatic.  Eyes: Pupils are equal, round, and reactive to light.  Neck: Normal range of motion.  Cardiovascular: Normal rate and  intact distal pulses.   Pulmonary/Chest: No respiratory distress.  Abdominal: Soft. Normal appearance. She exhibits no distension. There is tenderness in the right upper quadrant and epigastric area. There is no rigidity, no rebound, no guarding and negative Murphy's sign.  Musculoskeletal: Normal range of motion.  Neurological: She is alert and oriented to person, place, and time. No cranial nerve deficit.  Skin: Skin is warm and dry. No rash noted.  Psychiatric: She has a normal mood and affect. Her behavior is normal.  Nursing note and vitals reviewed.    ED Treatments / Results  Labs (all labs ordered are listed, but only abnormal results are displayed) Labs Reviewed  CBC WITH DIFFERENTIAL/PLATELET  COMPREHENSIVE METABOLIC PANEL  LIPASE, BLOOD    EKG  EKG Interpretation None       Radiology No results found.  Procedures Procedures (including critical care time)  DIAGNOSTIC STUDIES: Oxygen Saturation is 98% on RA, normal by my interpretation.    COORDINATION OF CARE: 12:00 AM Discussed treatment plan with pt at bedside which includes lab work and pt agreed to plan.    Medications Ordered in ED Medications  ondansetron (ZOFRAN) injection 4 mg (4 mg Intravenous Not Given 11/03/15 0048)  fentaNYL (SUBLIMAZE) injection 50 mcg (50 mcg Intravenous Not Given 11/03/15 0048)  sodium chloride 0.9 % bolus 500 mL (500 mLs Intravenous Not Given 11/03/15 0049)     Initial Impression / Assessment and Plan / ED Course  I have reviewed the triage vital signs and the nursing notes.  Pertinent labs & imaging results that were available during my care of the patient were reviewed by me and considered in my medical decision making (see chart for details).  Clinical Course   Patient was given fentanyl and Zofran.  Was back to baseline and refused labs.  Patient was discharged on Percocet and Zofran until returns she develops fever or worsening pain.   Final Clinical Impressions(s)  / ED Diagnoses   Final diagnoses:  Gallstones  Epigastric pain    New Prescriptions New Prescriptions   ONDANSETRON (ZOFRAN) 4 MG TABLET    Take 1 tablet (4 mg total) by mouth every 6 (six) hours.   OXYCODONE-ACETAMINOPHEN (PERCOCET/ROXICET) 5-325 MG TABLET    Take 2 tablets by mouth every 4 (four) hours as needed for severe pain.   I personally performed the services described in this documentation, which was scribed in my presence. The recorded information has been reviewed and considered.   Nelva Nayobert Devlyn Retter, MD 11/03/15 [phone]

## 2015-11-02 NOTE — ED Triage Notes (Signed)
Pt with known gallstones, scheduled for surgery on Monday.  Pain with nausea and vomiting worsened tonight

## 2015-11-03 MED ORDER — SODIUM CHLORIDE 0.9 % IV BOLUS (SEPSIS): 500.0000 mL | Freq: Once | INTRAVENOUS | Status: DC

## 2015-11-03 MED ORDER — FENTANYL CITRATE (PF) 100 MCG/2ML IJ SOLN
50.0000 ug | Freq: Once | INTRAMUSCULAR | Status: DC
Start: 2015-11-03 — End: 2015-11-03
  Filled 2015-11-03: qty 2

## 2015-11-03 MED ORDER — OXYCODONE-ACETAMINOPHEN 5-325 MG PO TABS: 2.0000 | ORAL_TABLET | ORAL | 0 refills | Status: DC | PRN

## 2015-11-03 MED ORDER — ONDANSETRON HCL 4 MG/2ML IJ SOLN
4.0000 mg | Freq: Once | INTRAMUSCULAR | Status: DC
  Filled 2015-11-03: qty 2

## 2015-11-03 MED ORDER — ONDANSETRON HCL 4 MG PO TABS: 4.0000 mg | ORAL_TABLET | Freq: Four times a day (QID) | ORAL | 0 refills | Status: AC

## 2015-11-03 NOTE — ED Notes (Addendum)
Pt states she is feeling better. Pt is requesting to not have any labs, no pain meds, or fluids. Pt states she is feeling better and would like to go home. Dr. Radford PaxBeaton aware.

## 2015-11-04 NOTE — H&P (Signed)
Surgical History and Physical  Subjective: This 20 year old female presents forRUQ > epigastric abdominal pain that she reports she first experienced towards the end of her pregnancy after eating pizza and fried chicken wings. She at that time thought it was contractions, but it turned out not to be contractions. Patient then again experienced the same pain post-partum after eating a cheeseburger and several other times for which she sought care and was referred to GI (Dr. Darrick Penna) and is now referred to surgery for further evaluation and management. Recently, she experienced recurrence of the pain once more after eating a salad, but acknowledges she had creamy cheese dressing on the salad. She says that she sometimes gets a sour bile taste in the back of her mouth when she lays down, but distinguishes that feels completely different than the post-prandial pain she gets after fatty foods. Patient reports she has never been told to avoid/minimize fatty foods to reduce her symptoms.  Review of Symptoms:  Constitutional:No fevers, chills, or unexplained weight loss Head:Atraumatic; no masses; no abnormalities Eyes:No visual changes or eye pain Cardiovascular: No chest pain or palpitations.  Respiratory:No cough, shortness of breath or wheezing  GastrointestinAbdominal pain and GERD as per HPI; no diarrhea, constipation, blood in stools, or vomiting  Musculoskeletal:No arthalgias, myalgias or joint swelling Skin:No rash or bothersome skin lesions   Past Medical History:Reviewed  Past Medical History  Pregnancy Gravida:  1 Pregnancy Para:  1 Surgical History:  c-section (07/2015) Medical Problems:  morbid obesity, hypothyroid, asthma Allergies:   NKDA Medications:   levothyroxine, albuterol   Social History:Reviewed  Social History  Preferred Language: English Race:  Other Ethnicity: Not Hispanic / Latino Age: 23 year Marital Status:  M  Smoking Status: Never smoker  reviewed on 11/01/2015  Functional Status ------------------------------------------------ Bathing: Normal Cooking: Normal Dressing: Normal Driving: Normal Eating: Normal Managing Meds: Normal Oral Care: Normal Shopping: Normal Toileting: Normal Transferring: Normal Walking: Normal  Cognitive Status ------------------------------------------------ Attention: Normal Decision Making: Normal Language: Normal Memory: Normal Motor: Normal Perception: Normal Problem Solving: Normal Visual and Spatial: Normal   Family History:Reviewed  Family Health History Sister, Living; Depression, asthma, seizure disorder Mother, Living; Hypertension (high blood pressure); Asthma; Depression Maternal Aunt, Living; breast cancer Maternal Grandfather; cancer (unspecified) Father   Vital Signs as of 10/31/2015:  Systolic 141: Diastolic 87: Heart Rate 107: Temp 96.23F (Temporal) Height 35ft 1in: Weight 224Lbs 0 Ounces   BMI : 42.32 kg/m2  Physical Exam: General:Well appearing, obese, well nourished, in no distress. Skin:no rash or prominent lesions Head:Atraumatic; no masses; no abnormalities Eyes:conjunctiva clear, EOM intact, PERRL Heart:RRR, no murmur Lungs:CTA bilaterally, no wheezes, rhonchi, rales.  Breathing unlabored. Abdomen:Soft, obese, minimal RUQ tenderness, no HSM, no masses. Extremities:No deformities, clubbing, cyanosis, or edema.   Assessment: 20 year old Female 3 months post-partum with symptomatic (painful) cholelithiasis, complicated by pertinent comorbidities including morbid obesity and GERD.   Plan:      - all test results discussed/explained      - advised patient to avoid fatty foods (meats, cheeses, dairy, and fried foods)      - all risks, benefits, and alternatives to cholecystectomy were discussed with the patient and her husband, who together elect to proceed, and informed consent was obtained.      - will plan for elective outpatient  laparocopic cholecystectomy next Monday 8/28      - surgical follow-up 2 weeks after surgery  -- Barbara Cower E. Earlene Plater, MD, RPVI [place]: [facility] General [facility]  Vascular Surgery Office #: [phone]

## 2015-11-05 ENCOUNTER — Ambulatory Visit (HOSPITAL_COMMUNITY)
Admission: RE | Admit: 2015-11-05 | Discharge: 2015-11-05 | Disposition: A | Discharge status: Home / Self Care | Payer: Medicaid Other | Source: Ambulatory Visit | Attending: Surgery | Admitting: Surgery

## 2015-11-05 ENCOUNTER — Ambulatory Visit (HOSPITAL_COMMUNITY): Payer: Medicaid Other | Admitting: Anesthesiology

## 2015-11-05 ENCOUNTER — Encounter (HOSPITAL_COMMUNITY)
Admission: RE | Disposition: A | Discharge status: Home / Self Care | Payer: Self-pay | Source: Ambulatory Visit | Attending: Surgery

## 2015-11-05 ENCOUNTER — Encounter (HOSPITAL_COMMUNITY): Payer: Self-pay | Admitting: *Deleted

## 2015-11-05 DIAGNOSIS — J45909 Unspecified asthma, uncomplicated: Secondary | ICD-10-CM | POA: Insufficient documentation

## 2015-11-05 DIAGNOSIS — K8010 Calculus of gallbladder with chronic cholecystitis without obstruction: Secondary | ICD-10-CM | POA: Diagnosis not present

## 2015-11-05 DIAGNOSIS — E039 Hypothyroidism, unspecified: Secondary | ICD-10-CM | POA: Insufficient documentation

## 2015-11-05 DIAGNOSIS — R1011 Right upper quadrant pain: Secondary | ICD-10-CM | POA: Diagnosis present

## 2015-11-05 DIAGNOSIS — Z6841 Body Mass Index (BMI) 40.0 and over, adult: Secondary | ICD-10-CM | POA: Diagnosis not present

## 2015-11-05 HISTORY — PX: CHOLECYSTECTOMY: SHX55

## 2015-11-05 SURGERY — LAPAROSCOPIC CHOLECYSTECTOMY
Anesthesia: General | Site: Abdomen

## 2015-11-05 MED ORDER — OXYCODONE-ACETAMINOPHEN 5-325 MG PO TABS: 1.0000 | ORAL_TABLET | ORAL | 0 refills | Status: DC | PRN

## 2015-11-05 MED ORDER — LIDOCAINE HCL (PF) 1 % IJ SOLN
INTRAMUSCULAR | Status: AC
  Filled 2015-11-05: qty 5

## 2015-11-05 MED ORDER — PROPOFOL 10 MG/ML IV BOLUS
INTRAVENOUS | Status: DC | PRN
  Administered 2015-11-05: 140 mg via INTRAVENOUS

## 2015-11-05 MED ORDER — ROCURONIUM BROMIDE 50 MG/5ML IV SOLN
INTRAVENOUS | Status: AC
Start: 2015-11-05 — End: 2015-11-05
  Filled 2015-11-05: qty 1

## 2015-11-05 MED ORDER — MIDAZOLAM HCL 2 MG/2ML IJ SOLN
1.0000 mg | INTRAMUSCULAR | Status: DC | PRN
  Administered 2015-11-05: 2 mg via INTRAVENOUS
  Filled 2015-11-05: qty 2

## 2015-11-05 MED ORDER — EPHEDRINE SULFATE 50 MG/ML IJ SOLN
INTRAMUSCULAR | Status: AC
  Filled 2015-11-05: qty 1

## 2015-11-05 MED ORDER — HYDROMORPHONE HCL 1 MG/ML IJ SOLN
0.2500 mg | INTRAMUSCULAR | Status: DC | PRN
  Administered 2015-11-05 (×2): 0.25 mg via INTRAVENOUS
  Filled 2015-11-05: qty 1

## 2015-11-05 MED ORDER — MIDAZOLAM HCL 5 MG/5ML IJ SOLN
INTRAMUSCULAR | Status: DC | PRN
  Administered 2015-11-05: 2 mg via INTRAVENOUS

## 2015-11-05 MED ORDER — SODIUM CHLORIDE 0.9 % IR SOLN
Status: DC | PRN
  Administered 2015-11-05: 1000 mL

## 2015-11-05 MED ORDER — SODIUM CHLORIDE 0.9 % IJ SOLN
INTRAMUSCULAR | Status: AC
  Filled 2015-11-05: qty 10

## 2015-11-05 MED ORDER — HEMOSTATIC AGENTS (NO CHARGE) OPTIME
TOPICAL | Status: DC | PRN
Start: 2015-11-05 — End: 2015-11-05
  Administered 2015-11-05: 1 via TOPICAL

## 2015-11-05 MED ORDER — ONDANSETRON HCL 4 MG/2ML IJ SOLN
4.0000 mg | Freq: Once | INTRAMUSCULAR | Status: AC
  Administered 2015-11-05: 4 mg via INTRAVENOUS
  Filled 2015-11-05: qty 2

## 2015-11-05 MED ORDER — SUCCINYLCHOLINE CHLORIDE 20 MG/ML IJ SOLN
INTRAMUSCULAR | Status: DC | PRN
  Administered 2015-11-05: 160 mg via INTRAVENOUS

## 2015-11-05 MED ORDER — FENTANYL CITRATE (PF) 100 MCG/2ML IJ SOLN
INTRAMUSCULAR | Status: AC
  Filled 2015-11-05: qty 2

## 2015-11-05 MED ORDER — MIDAZOLAM HCL 2 MG/2ML IJ SOLN
INTRAMUSCULAR | Status: AC
  Filled 2015-11-05: qty 2

## 2015-11-05 MED ORDER — FENTANYL CITRATE (PF) 250 MCG/5ML IJ SOLN
INTRAMUSCULAR | Status: AC
  Filled 2015-11-05: qty 5

## 2015-11-05 MED ORDER — LIDOCAINE HCL (PF) 1 % IJ SOLN
INTRAMUSCULAR | Status: AC
  Filled 2015-11-05: qty 30

## 2015-11-05 MED ORDER — ROCURONIUM BROMIDE 100 MG/10ML IV SOLN
INTRAVENOUS | Status: DC | PRN
  Administered 2015-11-05: 25 mg via INTRAVENOUS
  Administered 2015-11-05: 5 mg via INTRAVENOUS
  Administered 2015-11-05: 10 mg via INTRAVENOUS

## 2015-11-05 MED ORDER — BUPIVACAINE HCL (PF) 0.5 % IJ SOLN
INTRAMUSCULAR | Status: AC
  Filled 2015-11-05: qty 30

## 2015-11-05 MED ORDER — LACTATED RINGERS IV SOLN
INTRAVENOUS | Status: DC
  Administered 2015-11-05 (×2): via INTRAVENOUS

## 2015-11-05 MED ORDER — GLYCOPYRROLATE 0.2 MG/ML IJ SOLN
INTRAMUSCULAR | Status: DC | PRN
  Administered 2015-11-05: 0.6 mg via INTRAVENOUS

## 2015-11-05 MED ORDER — PROPOFOL 10 MG/ML IV BOLUS
INTRAVENOUS | Status: AC
  Filled 2015-11-05: qty 20

## 2015-11-05 MED ORDER — NEOSTIGMINE METHYLSULFATE 10 MG/10ML IV SOLN
INTRAVENOUS | Status: DC | PRN
  Administered 2015-11-05 (×2): 2 mg via INTRAVENOUS

## 2015-11-05 MED ORDER — LIDOCAINE HCL 1 % IJ SOLN
INTRAMUSCULAR | Status: DC | PRN
  Administered 2015-11-05: 30 mg via INTRADERMAL

## 2015-11-05 MED ORDER — LIDOCAINE HCL 1 % IJ SOLN
INTRAMUSCULAR | Status: DC | PRN
  Administered 2015-11-05: 12 mL via INTRAMUSCULAR

## 2015-11-05 MED ORDER — CHLORHEXIDINE GLUCONATE CLOTH 2 % EX PADS: 6.0000 | MEDICATED_PAD | Freq: Once | CUTANEOUS | Status: DC

## 2015-11-05 MED ORDER — GLYCOPYRROLATE 0.2 MG/ML IJ SOLN
INTRAMUSCULAR | Status: AC
  Filled 2015-11-05: qty 3

## 2015-11-05 MED ORDER — DEXAMETHASONE SODIUM PHOSPHATE 4 MG/ML IJ SOLN
4.0000 mg | Freq: Once | INTRAMUSCULAR | Status: AC
  Administered 2015-11-05: 4 mg via INTRAVENOUS
  Filled 2015-11-05: qty 1

## 2015-11-05 MED ORDER — FENTANYL CITRATE (PF) 250 MCG/5ML IJ SOLN
INTRAMUSCULAR | Status: AC
Start: 2015-11-05 — End: 2015-11-05
  Filled 2015-11-05: qty 5

## 2015-11-05 MED ORDER — FENTANYL CITRATE (PF) 100 MCG/2ML IJ SOLN
INTRAMUSCULAR | Status: DC | PRN
  Administered 2015-11-05: 100 ug via INTRAVENOUS
  Administered 2015-11-05 (×3): 50 ug via INTRAVENOUS
  Administered 2015-11-05: 100 ug via INTRAVENOUS

## 2015-11-05 MED ORDER — CEFAZOLIN SODIUM-DEXTROSE 2-4 GM/100ML-% IV SOLN
2.0000 g | INTRAVENOUS | Status: AC
  Administered 2015-11-05: 2 g via INTRAVENOUS
  Filled 2015-11-05: qty 100

## 2015-11-05 SURGICAL SUPPLY — 43 items
APPLIER CLIP LAPSCP 10X32 DD (CLIP) ×3 IMPLANT
BAG HAMPER (MISCELLANEOUS) ×3 IMPLANT
CHLORAPREP W/TINT 26ML (MISCELLANEOUS) ×3 IMPLANT
CLOTH BEACON ORANGE TIMEOUT ST (SAFETY) ×3 IMPLANT
COVER LIGHT HANDLE STERIS (MISCELLANEOUS) ×6 IMPLANT
DECANTER SPIKE VIAL GLASS SM (MISCELLANEOUS) ×6 IMPLANT
DERMABOND ADVANCED (GAUZE/BANDAGES/DRESSINGS) ×2
DERMABOND ADVANCED .7 DNX12 (GAUZE/BANDAGES/DRESSINGS) ×1 IMPLANT
DEVICE TROCAR PUNCTURE CLOSURE (ENDOMECHANICALS) ×3 IMPLANT
ELECT REM PT RETURN 9FT ADLT (ELECTROSURGICAL) ×3
ELECTRODE REM PT RTRN 9FT ADLT (ELECTROSURGICAL) ×1 IMPLANT
FILTER SMOKE EVAC LAPAROSHD (FILTER) ×3 IMPLANT
FORMALIN 10 PREFIL 120ML (MISCELLANEOUS) ×3 IMPLANT
GLOVE BIOGEL PI IND STRL 7.0 (GLOVE) ×1 IMPLANT
GLOVE BIOGEL PI IND STRL 7.5 (GLOVE) ×1 IMPLANT
GLOVE BIOGEL PI INDICATOR 7.0 (GLOVE) ×2
GLOVE BIOGEL PI INDICATOR 7.5 (GLOVE) ×2
GLOVE ECLIPSE 7.0 STRL STRAW (GLOVE) ×3 IMPLANT
GOWN STRL REUS W/ TWL XL LVL3 (GOWN DISPOSABLE) ×1 IMPLANT
GOWN STRL REUS W/TWL LRG LVL3 (GOWN DISPOSABLE) ×6 IMPLANT
GOWN STRL REUS W/TWL XL LVL3 (GOWN DISPOSABLE) ×2
HEMOSTAT SNOW SURGICEL 2X4 (HEMOSTASIS) ×3 IMPLANT
INST SET LAPROSCOPIC AP (KITS) ×3 IMPLANT
IV NS IRRIG 3000ML ARTHROMATIC (IV SOLUTION) IMPLANT
KIT ROOM TURNOVER APOR (KITS) ×3 IMPLANT
MANIFOLD NEPTUNE II (INSTRUMENTS) ×3 IMPLANT
NEEDLE INSUFFLATION 14GA 120MM (NEEDLE) ×3 IMPLANT
NS IRRIG 1000ML POUR BTL (IV SOLUTION) ×3 IMPLANT
PACK LAP CHOLE LZT030E (CUSTOM PROCEDURE TRAY) ×3 IMPLANT
PAD ARMBOARD 7.5X6 YLW CONV (MISCELLANEOUS) ×3 IMPLANT
POUCH SPECIMEN RETRIEVAL 10MM (ENDOMECHANICALS) ×3 IMPLANT
SET BASIN LINEN APH (SET/KITS/TRAYS/PACK) ×3 IMPLANT
SET TUBE IRRIG SUCTION NO TIP (IRRIGATION / IRRIGATOR) IMPLANT
SLEEVE ENDOPATH XCEL 5M (ENDOMECHANICALS) ×6 IMPLANT
SUT VIC AB 4-0 PS2 27 (SUTURE) ×6 IMPLANT
SUT VICRYL 0 UR6 27IN ABS (SUTURE) ×3 IMPLANT
SUT VICRYL AB 3-0 FS1 BRD 27IN (SUTURE) ×3 IMPLANT
TROCAR ENDO BLADELESS 11MM (ENDOMECHANICALS) ×3 IMPLANT
TROCAR XCEL NON-BLD 5MMX100MML (ENDOMECHANICALS) ×3 IMPLANT
TUBE CONNECTING 12'X1/4 (SUCTIONS)
TUBE CONNECTING 12X1/4 (SUCTIONS) IMPLANT
TUBING INSUFFLATION (TUBING) ×3 IMPLANT
WARMER LAPAROSCOPE (MISCELLANEOUS) ×3 IMPLANT

## 2015-11-05 NOTE — Transfer of Care (Signed)
Immediate Anesthesia Transfer of Care Note  Patient: Jenny Jackson  Procedure(s) Performed: Procedure(s): LAPAROSCOPIC CHOLECYSTECTOMY (N/A)  Patient Location: PACU  Anesthesia Type:General  Level of Consciousness: awake and patient cooperative  Airway & Oxygen Therapy: Patient Spontanous Breathing and Patient connected to face mask oxygen  Post-op Assessment: Report given to RN, Post -op Vital signs reviewed and stable and Patient moving all extremities  Post vital signs: Reviewed and stable  Last Vitals:  Vitals:   11/05/15 0725 11/05/15 0730  BP: 123/75 118/78  Resp: (!) 0 (!) 23  Temp:      Last Pain:  Vitals:   11/05/15 0658  TempSrc: Oral      Patients Stated Pain Goal: 8 (11/05/15 16100658)  Complications: No apparent anesthesia complications

## 2015-11-05 NOTE — Interval H&P Note (Signed)
History and Physical Interval Note:  11/05/2015 7:27 AM  Karren BurlyGraciela R Reising  has presented today for surgery, with the diagnosis of cholelithiasis  The various methods of treatment have been discussed with the patient and family. After consideration of risks, benefits and other options for treatment, the patient has consented to  Procedure(s): LAPAROSCOPIC CHOLECYSTECTOMY (N/A) as a surgical intervention .  The patient's history has been reviewed, patient examined, no change in status, stable for surgery.  I have reviewed the patient's chart and labs.  Questions were answered to the patient's satisfaction.     Ancil LinseyJason Evan Davis

## 2015-11-05 NOTE — Anesthesia Postprocedure Evaluation (Signed)
Anesthesia Post Note  Patient: Karren BurlyGraciela R Vickers  Procedure(s) Performed: Procedure(s) (LRB): LAPAROSCOPIC CHOLECYSTECTOMY (N/A)  Patient location during evaluation: PACU Anesthesia Type: General Level of consciousness: awake and alert, oriented and patient cooperative Pain management: pain level controlled Vital Signs Assessment: post-procedure vital signs reviewed and stable Respiratory status: spontaneous breathing, nonlabored ventilation, respiratory function stable and patient connected to face mask oxygen Cardiovascular status: blood pressure returned to baseline and stable Postop Assessment: no signs of nausea or vomiting Anesthetic complications: no    Last Vitals:  Vitals:   11/05/15 0900 11/05/15 0915  BP: 137/75 (!) 142/80  Pulse: 63 62  Resp: 17 14  Temp:      Last Pain:  Vitals:   11/05/15 0915  TempSrc:   PainSc: 2                  Keatyn Jawad J

## 2015-11-05 NOTE — Op Note (Signed)
SURGICAL OPERATIVE REPORT   DATE OF PROCEDURE: 11/05/2015  ATTENDING Surgeon(s): Ancil Linsey, MD  ASSISTANT(S): Franky Macho, MD   ANESTHESIA: GETA  PRE-OPERATIVE DIAGNOSIS: Symptomatic Cholelithiasis (K80.20)  POST-OPERATIVE DIAGNOSIS: Symptomatic Cholelithiasis (K80.20)  PROCEDURE(S): (cpt's: 47562) 1.) Laparoscopic Cholecystectomy  INTRAOPERATIVE FINDINGS: mild pericholecystic inflammation with filmy adhesions tacking the transverse colon to patient's mild-moderately thickened gallbladder wall  INTRAOPERATIVE FLUIDS: 1200 mL crystalloid   ESTIMATED BLOOD LOSS: Minimal (<30 mL)   URINE OUTPUT: No foley  SPECIMENS: Gallbladder  IMPLANTS: None  DRAINS: None   COMPLICATIONS: None apparent   CONDITION AT COMPLETION: Hemodynamically stable and extubated  DISPOSITION: PACU   INDICATION(S) FOR PROCEDURE:  Patient is a 20 y.o. female who previously presented with post-prandial RUQ > epigastric abdominal pain after eating fatty foods in particular. Ultrasound suggested cholelithiasis without cholecystitis. All risks, benefits, and alternatives to above elective procedures were discussed with the patient, who elected to proceed, and informed consent was accordingly obtained at that time. 3 days prior to her scheduled cholecystectomy, patient represented to [facility] ED with RUQ > epigastric abdominal pain after eating fish and milk, which resolved in the ED with pain medication and NPO. Patient was discharged from ED and presents today for elective outpatient cholecystectomy as scheduled.  DETAILS OF PROCEDURE:  Patient was brought to the operating suite and appropriately identified. General anesthesia was administered along with peri-operative prophylactic IV antibiotics, and endotracheal intubation was performed by anesthesiologist, along with NG/OG tube for gastric decompression. In supine position, operative site was prepped and draped in usual sterile fashion, and following a  brief time out, initial 5 mm incision was made in a natural skin crease just above the umbilicus (below well-healed umbilical piercing). Fascia was then elevated, and a Verress needle was inserted and its proper position confirmed using aspiration and saline meniscus test.  Upon insufflation of the abdominal cavity with carbon dioxide to a well-tolerated pressure of 12-15 mmHg, 5 mm peri-umbilical port followed by laparoscope were inserted and used to inspect the abdominal cavity and its contents with no injuries from insertion of the first trochar noted. Three additional trocars were inserted, one at the epigastric position (10 mm) and two along the Right costal margin (5 mm). The table was then placed in reverse Trendelenburg position with the Right side up. Filmy adhesions between the gallbladder and omentum/duodenum/transverse colon were lysed using combined blunt and sharp dissection. The apex/dome of the gallbladder was grasped with an atraumatic grasper passed through the lateral port and retracted apically over the liver. The infundibulum was also grasped and retracted, exposing Calot's triangle. The peritoneum overlying the gallbladder infundibulum was incised and dissected free of surrounding peritoneal attachments, revealing the cystic duct and cystic artery, which were clipped twice on the patient side and once on the gallbladder specimen side close to the gallbladder. The gallbladder was then dissected from its peritoneal attachments to the liver using electrocautery, and the gallbladder was placed into a laparoscopic specimen bag and removed from the abdominal cavity via the epigastric port site. Hemostasis and secure placement of clips were confirmed, and intra-peritoneal cavity was inspected with no additional findings. Endoclose laparoscopic fascial closure device was then used to re-approximate fascia at the 10 mm epigastric port site.  All ports were then removed under direct visualization,  and abdominal cavity was desuflated. All port sites were irrigated/cleaned, additional local anesthetic was injected at each incision, 3-0 Vicryl was used to re-approximate dermis at 10 mm port site(s), and subcuticular 4-0 Vicryl suture  was used to re-approximate skin. Skin was then cleaned, dried, and sterile skin glue was applied. Patient was then safely able to be awakened, extubated, and transferred to PACU for post-operative monitoring and care.   I was present for all aspects of procedure, and there were no intra-operative complications apparent.

## 2015-11-05 NOTE — Anesthesia Procedure Notes (Signed)
Procedure Name: Intubation Date/Time: 11/05/2015 7:42 AM Performed by: Despina HiddenIDACAVAGE, Jaquana Geiger J Pre-anesthesia Checklist: Patient identified, Patient being monitored, Timeout performed, Emergency Drugs available and Suction available Patient Re-evaluated:Patient Re-evaluated prior to inductionOxygen Delivery Method: Circle System Utilized Preoxygenation: Pre-oxygenation with 100% oxygen Intubation Type: IV induction, Rapid sequence and Cricoid Pressure applied Ventilation: Mask ventilation without difficulty Laryngoscope Size: Mac and 3 Grade View: Grade I Tube type: Oral Tube size: 7.0 mm Number of attempts: 1 Airway Equipment and Method: stylet Placement Confirmation: ETT inserted through vocal cords under direct vision,  positive ETCO2 and breath sounds checked- equal and bilateral Secured at: 22 cm Tube secured with: Tape Dental Injury: Teeth and Oropharynx as per pre-operative assessment

## 2015-11-05 NOTE — Anesthesia Preprocedure Evaluation (Signed)
Anesthesia Evaluation  Patient identified by MRN, date of birth, ID band Patient awake    Reviewed: Allergy & Precautions, NPO status , Patient's Chart, lab work & pertinent test results  Airway Mallampati: II  TM Distance: >3 FB     Dental  (+) Teeth Intact, Dental Advisory Given   Pulmonary asthma ,    breath sounds clear to auscultation       Cardiovascular  Rhythm:Regular Rate:Normal     Neuro/Psych PSYCHIATRIC DISORDERS (ADHD) Anxiety    GI/Hepatic GERD  ,  Endo/Other  Hypothyroidism   Renal/GU      Musculoskeletal   Abdominal   Peds  Hematology   Anesthesia Other Findings   Reproductive/Obstetrics                             Anesthesia Physical Anesthesia Plan  ASA: II  Anesthesia Plan: General   Post-op Pain Management:    Induction: Intravenous, Rapid sequence and Cricoid pressure planned  Airway Management Planned: Oral ETT  Additional Equipment:   Intra-op Plan:   Post-operative Plan: Extubation in OR  Informed Consent: I have reviewed the patients History and Physical, chart, labs and discussed the procedure including the risks, benefits and alternatives for the proposed anesthesia with the patient or authorized representative who has indicated his/her understanding and acceptance.     Plan Discussed with:   Anesthesia Plan Comments:         Anesthesia Quick Evaluation

## 2015-11-08 ENCOUNTER — Encounter (HOSPITAL_COMMUNITY): Payer: Self-pay | Admitting: Surgery

## 2016-05-16 ENCOUNTER — Ambulatory Visit (INDEPENDENT_AMBULATORY_CARE_PROVIDER_SITE_OTHER): Payer: Self-pay

## 2016-05-16 ENCOUNTER — Encounter (HOSPITAL_COMMUNITY): Payer: Self-pay | Admitting: *Deleted

## 2016-05-16 ENCOUNTER — Ambulatory Visit (HOSPITAL_COMMUNITY)
Admission: EM | Admit: 2016-05-16 | Discharge: 2016-05-16 | Disposition: A | Discharge status: Home / Self Care | Payer: Self-pay | Attending: Emergency Medicine | Admitting: Emergency Medicine

## 2016-05-16 DIAGNOSIS — R109 Unspecified abdominal pain: Secondary | ICD-10-CM

## 2016-05-16 DIAGNOSIS — R1013 Epigastric pain: Secondary | ICD-10-CM

## 2016-05-16 MED ORDER — DEXAMETHASONE SODIUM PHOSPHATE 10 MG/ML IJ SOLN: 10.0000 mg | Freq: Once | INTRAMUSCULAR | Status: DC

## 2016-05-16 MED ORDER — IPRATROPIUM-ALBUTEROL 0.5-2.5 (3) MG/3ML IN SOLN: 3.0000 mL | Freq: Once | RESPIRATORY_TRACT | Status: DC

## 2016-05-16 NOTE — Discharge Instructions (Signed)
Reason for your pain is uncertain at this time. He will probably need to have some imaging to determine the cause. In the meantime take Zantac twice a day and/or omeprazole to reduce acid. The type of pain you are having is colicky. There may be a problem with the tubes near the pancreas and where your gallbladder used to be. Gastritis and possibly ulcer may also be involved however not typical at this time. Call your surgeon for an appointment early Monday. If you get worse, pain increases you have vomiting, fevers or other problems go to the emergency department promptly.

## 2016-05-16 NOTE — ED Triage Notes (Signed)
Pt  Reports   Epigastric  Pain      With  Some  Nausea    No  Vomiting  history  Of   Stomach  Problems  In  Past       History  Of  Gallbladder     Disease        Symptoms   Started  Two  Days  Ago        Husband  At  Bedside

## 2016-05-16 NOTE — ED Provider Notes (Signed)
CSN: [id number]     Arrival date & time 05/16/16  1729 History   First MD Initiated Contact with Patient 05/16/16 1844     Chief Complaint  Patient presents with  . Abdominal Pain   (Consider location/radiation/quality/duration/timing/severity/associated sxs/prior Treatment) 21 year old obese female states that 2 days ago after eating a meal she experienced sudden abdominal pain just above the umbilicus. She states the pain is becoming more frequent and getting worse. It tends to come and go. She may have it several times during the day. Duration is 30-60 seconds. Sometimes associated with nausea. Nothing known makes it worse, nothing makes it better. It occurs during the day and while asleep. Last summer she had a lap  cholecystectomy.      Past Medical History:  Diagnosis Date  . Anxiety   . Asthma   . Depression   . Hypothyroidism    Past Surgical History:  Procedure Laterality Date  . CESAREAN SECTION    . CHOLECYSTECTOMY N/A 11/05/2015   Procedure: LAPAROSCOPIC CHOLECYSTECTOMY;  Surgeon: Ancil LinseyJason Evan Davis, MD;  Location: [facility];  Service: General;  Laterality: N/A;   Family History  Problem Relation Age of Onset  . Asthma Mother   . Depression Mother   . Hypotension Mother   . Depression Sister   . Asthma Sister   . Seizures Sister   . Cancer Maternal Aunt     Breast, and mouth  . Cancer Maternal Grandfather   . Colon cancer Neg Hx    Social History  Substance Use Topics  . Smoking status: Never Smoker  . Smokeless tobacco: Never Used  . Alcohol use No   OB History    No data available     Review of Systems  Constitutional: Negative.   HENT: Negative.   Respiratory: Negative.   Cardiovascular: Negative for chest pain.  Gastrointestinal: Positive for abdominal pain and nausea. Negative for constipation, diarrhea and vomiting.  Genitourinary: Negative.   Neurological: Negative.   All other systems reviewed and are negative.   Allergies  Patient has no  known allergies.  Home Medications   Prior to Admission medications   Medication Sig Start Date End Date Taking? Authorizing Provider  albuterol (PROVENTIL HFA;VENTOLIN HFA) 108 (90 Base) MCG/ACT inhaler Inhale 2 puffs into the lungs every 6 (six) hours as needed for wheezing or shortness of breath.    Historical Provider, MD  IRON PO Take 2 tablets by mouth daily.    Historical Provider, MD  ondansetron (ZOFRAN) 4 MG tablet Take 1 tablet (4 mg total) by mouth every 6 (six) hours. 11/03/15   Nelva Nayobert Beaton, MD   Meds Ordered and Administered this Visit  Medications - No data to display  BP 122/70 (BP Location: Right Arm)   Pulse 78   Temp 98.6 F (37 C) (Oral)   Resp 20   SpO2 100%  No data found.   Physical Exam  Constitutional: She is oriented to person, place, and time. She appears well-developed and well-nourished. No distress.  Eyes: EOM are normal.  Neck: Normal range of motion. Neck supple.  Cardiovascular: Normal rate and regular rhythm.   Pulmonary/Chest: Effort normal and breath sounds normal. No respiratory distress.  Abdominal: Soft. She exhibits no mass. There is no guarding.  Bowel sounds hypoactive. Tenderness in the lower epigastrium. No rebound or guarding. No masses. On occasion pressure to the left abdomen would cause pain in the right abdomen.  Musculoskeletal: She exhibits no edema.  Neurological: She is alert  and oriented to person, place, and time. She exhibits normal muscle tone.  Skin: Skin is warm and dry.  Psychiatric: She has a normal mood and affect.  Nursing note and vitals reviewed.   Urgent Care Course     Procedures (including critical care time)  Labs Review Labs Reviewed - No data to display  Imaging Review Dg Abd 2 Views  Result Date: 05/16/2016 CLINICAL DATA:  Sharp abdominal pain EXAM: ABDOMEN - 2 VIEW COMPARISON:  None. FINDINGS: Surgical clips in the right upper quadrant. No gross free air. Nonobstructed bowel-gas pattern. No  abnormal calcifications. IMPRESSION: Nonobstructed bowel-gas pattern Electronically Signed   By: Jasmine Pang M.D.   On: 05/16/2016 19:57     Visual Acuity Review  Right Eye Distance:   Left Eye Distance:   Bilateral Distance:    Right Eye Near:   Left Eye Near:    Bilateral Near:         MDM   1. Colicky epigastric pain    During the H&P the patient was having no pain. In most the time she was waiting for x-ray she was not having pain. When I checked on her little later before the x-ray result was back she was bending over stating that the pain started just seconds before of the door. He was over and about 1 minute. Reason for your pain is uncertain at this time. He will probably need to have some imaging to determine the cause. In the meantime take Zantac twice a day and/or omeprazole to reduce acid. The type of pain you are having is colicky. There may be a problem with the tubes near the pancreas and where your gallbladder used to be. Gastritis and possibly ulcer may also be involved however not typical at this time. Call your surgeon for an appointment early Monday. If you get worse, pain increases you have vomiting, fevers or other problems go to the emergency department promptly.     Hayden Rasmussen, NP 05/16/16 2013

## 2018-02-26 IMAGING — US US ABDOMEN COMPLETE
1 series · 13 of 25 positions shown · non-contrast
Comparison: CT scan of same day.

CLINICAL DATA: Acute epigastric abdominal pain, elevated liver
function tests.

EXAM:
ABDOMEN ULTRASOUND COMPLETE

[Series 1: us abdomen complete · 0.27mm/px · 13 of 92 slices shown]
[im 1/92]
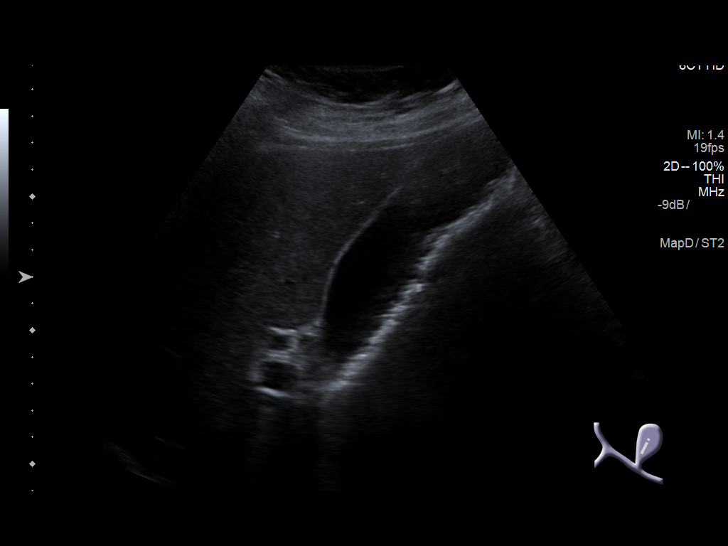
[im 8/92]
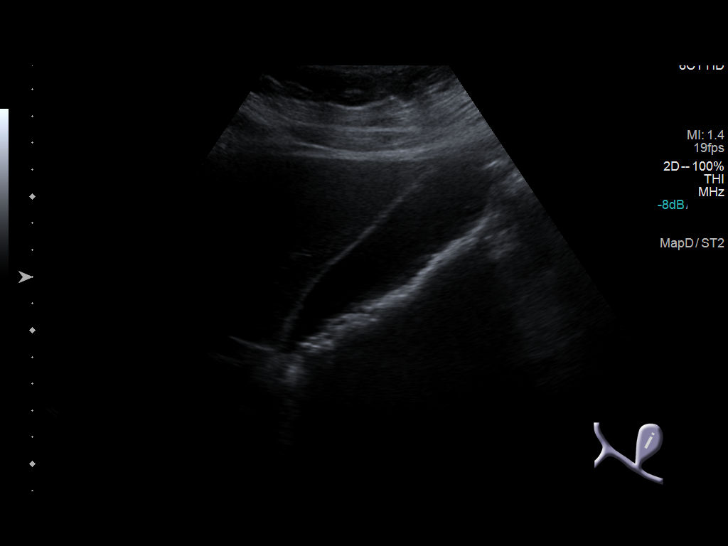
[im 16/92]
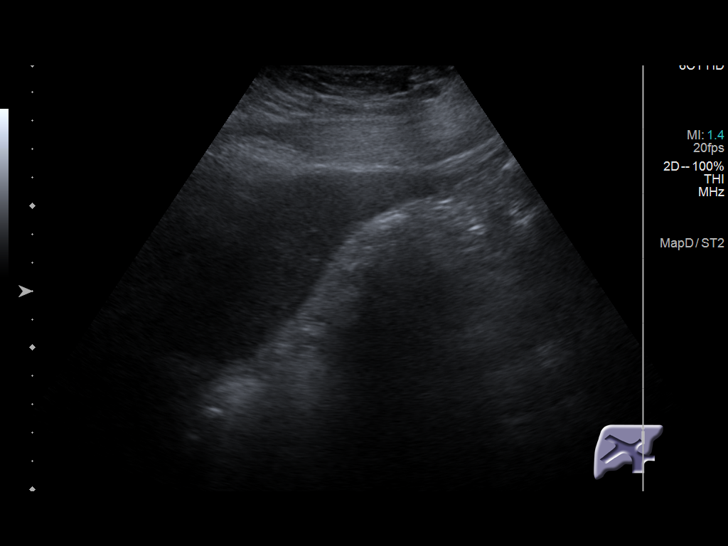
[im 23/92]
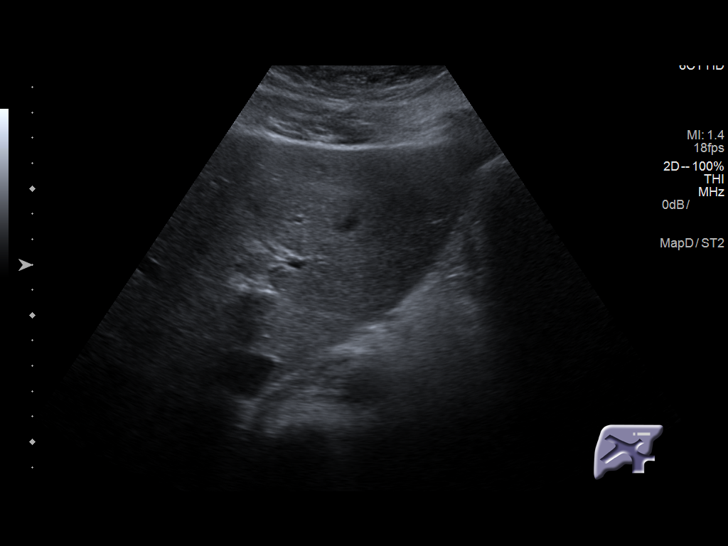
[im 31/92]
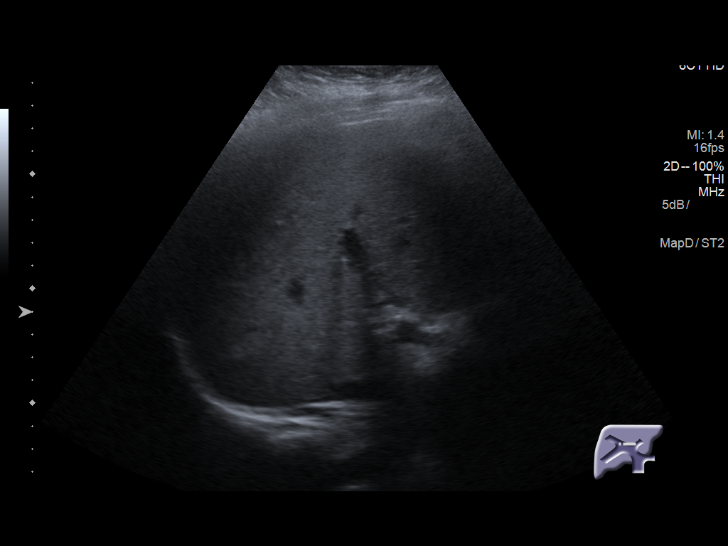
[im 38/92]
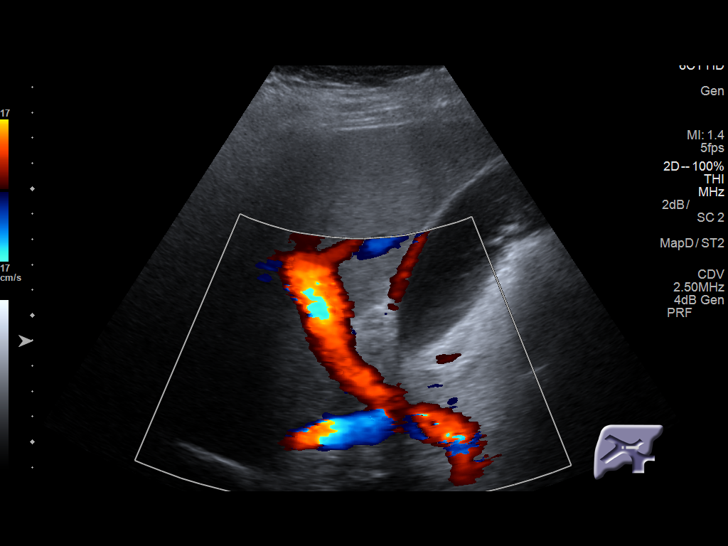
[im 46/92]
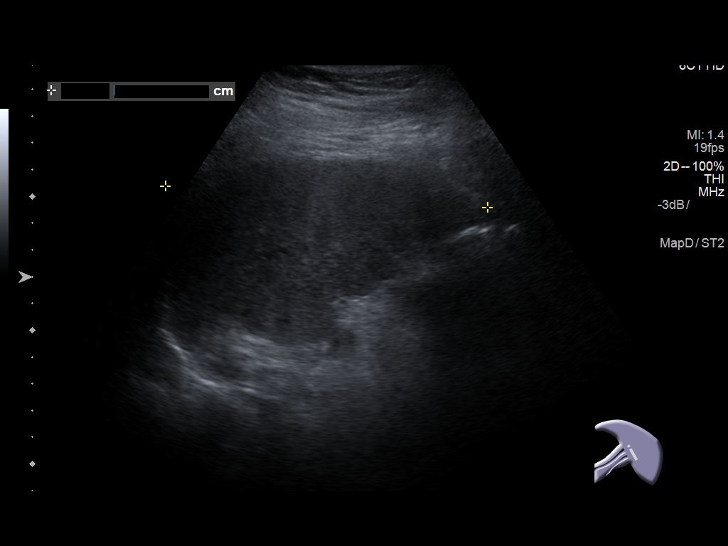
[im 54/92]
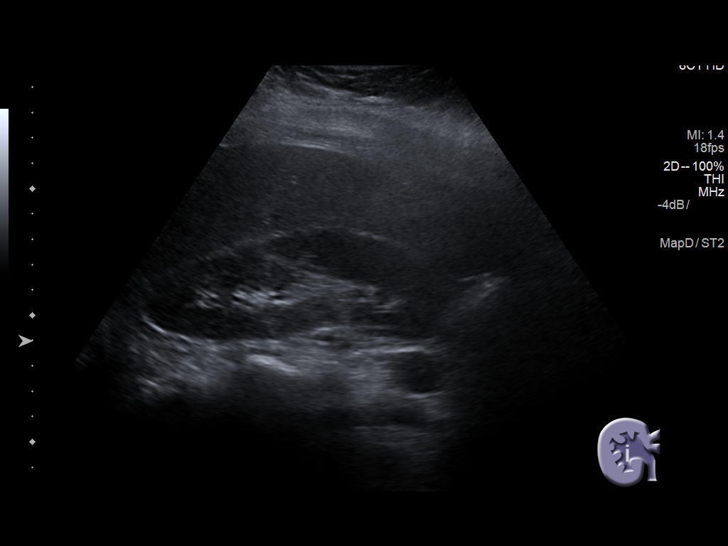
[im 61/92]
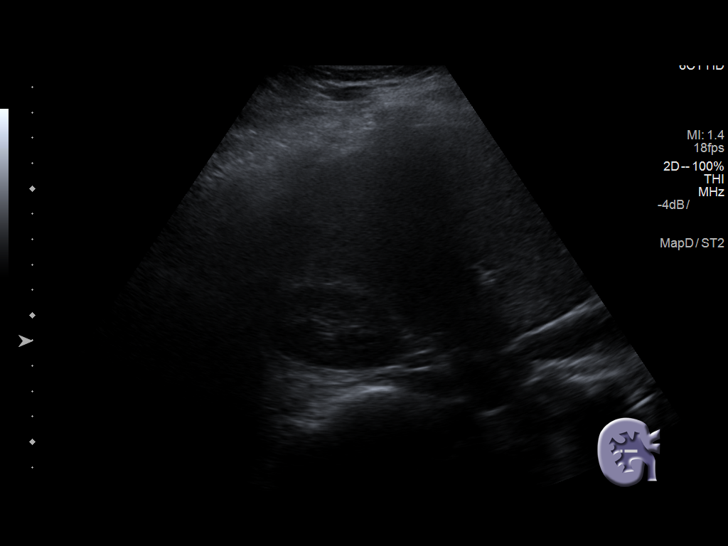
[im 69/92]
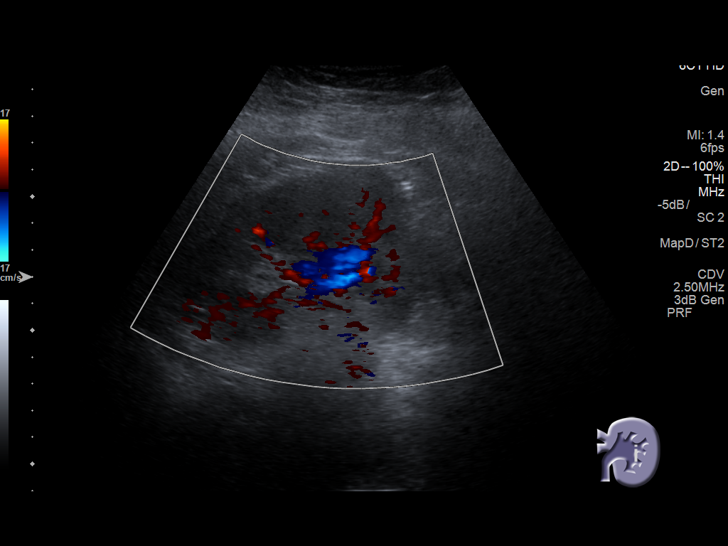
[im 76/92]
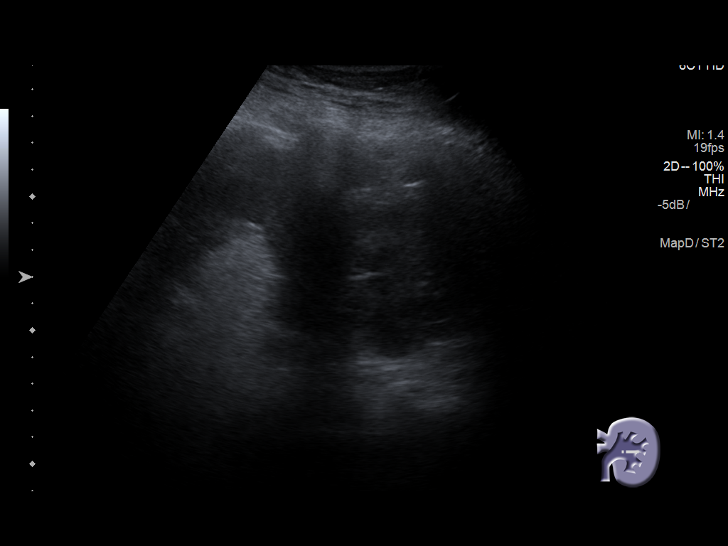
[im 84/92]
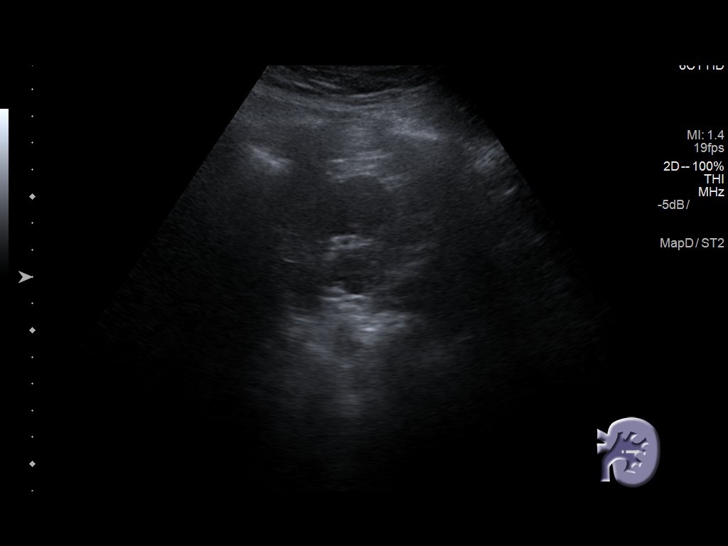
[im 92/92]
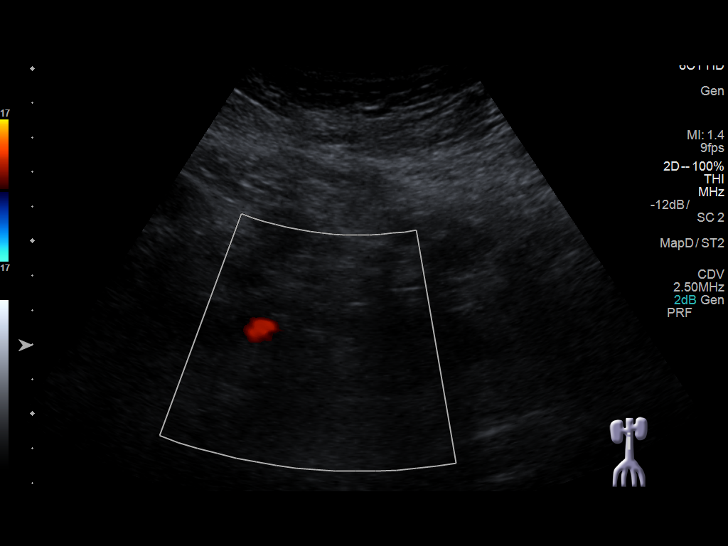

[13 of 25 positions shown; findings below may reference images not displayed]

FINDINGS: Gallbladder: Multiple gallstones are noted with the largest
measuring 7 mm. No significant gallbladder wall thickening or
pericholecystic fluid is noted. Positive sonographic Murphy's sign
is noted.

Common bile duct: Diameter: 6 mm which is within normal limits.

Liver: No focal lesion identified. Increased echogenicity is noted
consistent with fatty infiltration.

IVC: No abnormality visualized.

Pancreas: Visualized portion unremarkable.

Spleen: Size and appearance within normal limits.

Right Kidney: Length: 12.5 cm. Echogenicity within normal limits. No
mass or hydronephrosis visualized.

Left Kidney: Length: 13.3 cm. Echogenicity within normal limits. No
mass or hydronephrosis visualized.

Abdominal aorta: No aneurysm visualized.

Other findings: None.
IMPRESSION: Probable fatty infiltration of the liver. Cholelithiasis is noted
without gallbladder wall thickening or pericholecystic fluid, but
positive sonographic Murphy's sign is noted. If there is clinical
concern for acute cholecystitis, HIDA scan may be performed for
further evaluation.

There is no evidence of intrahepatic or extrahepatic biliary
dilatation.

## 2018-12-24 IMAGING — DX DG ABDOMEN 2V
2 series · 2 of 2 positions shown · non-contrast
Comparison: None.

CLINICAL DATA: Sharp abdominal pain

EXAM:
ABDOMEN - 2 VIEW

[abdomen erect]
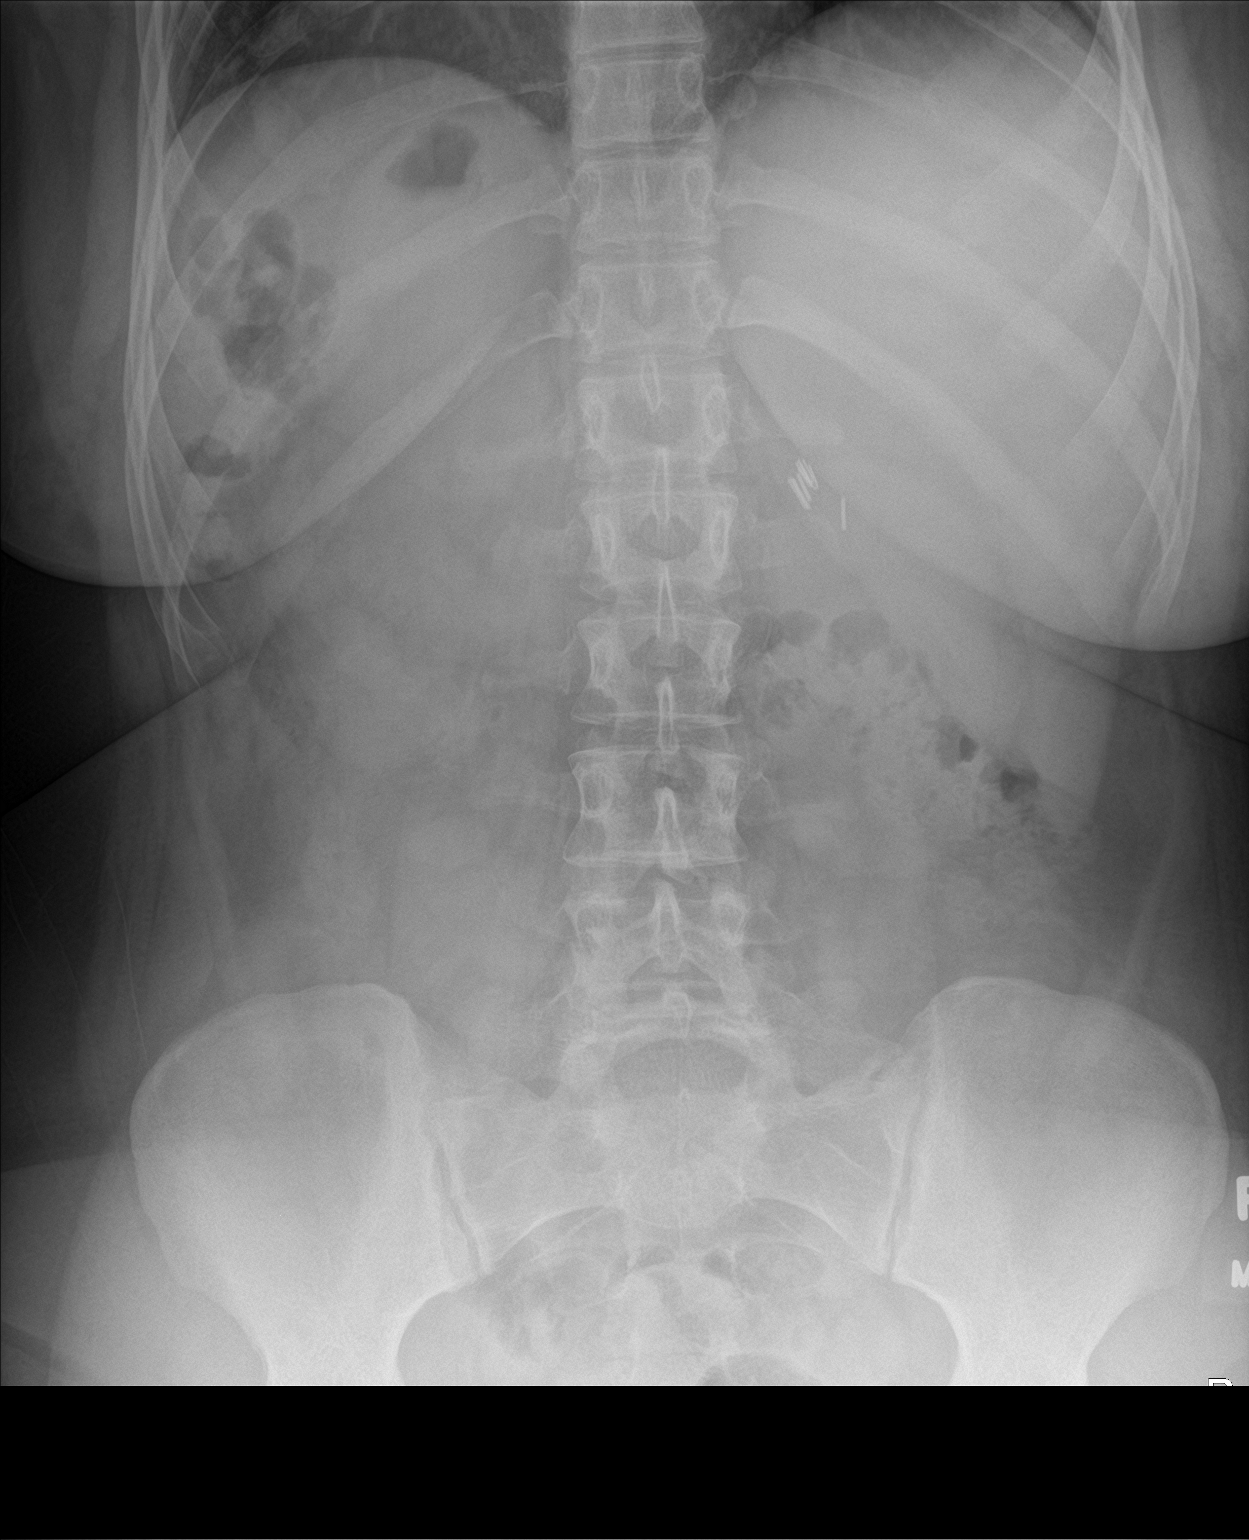

[abdomen supine]
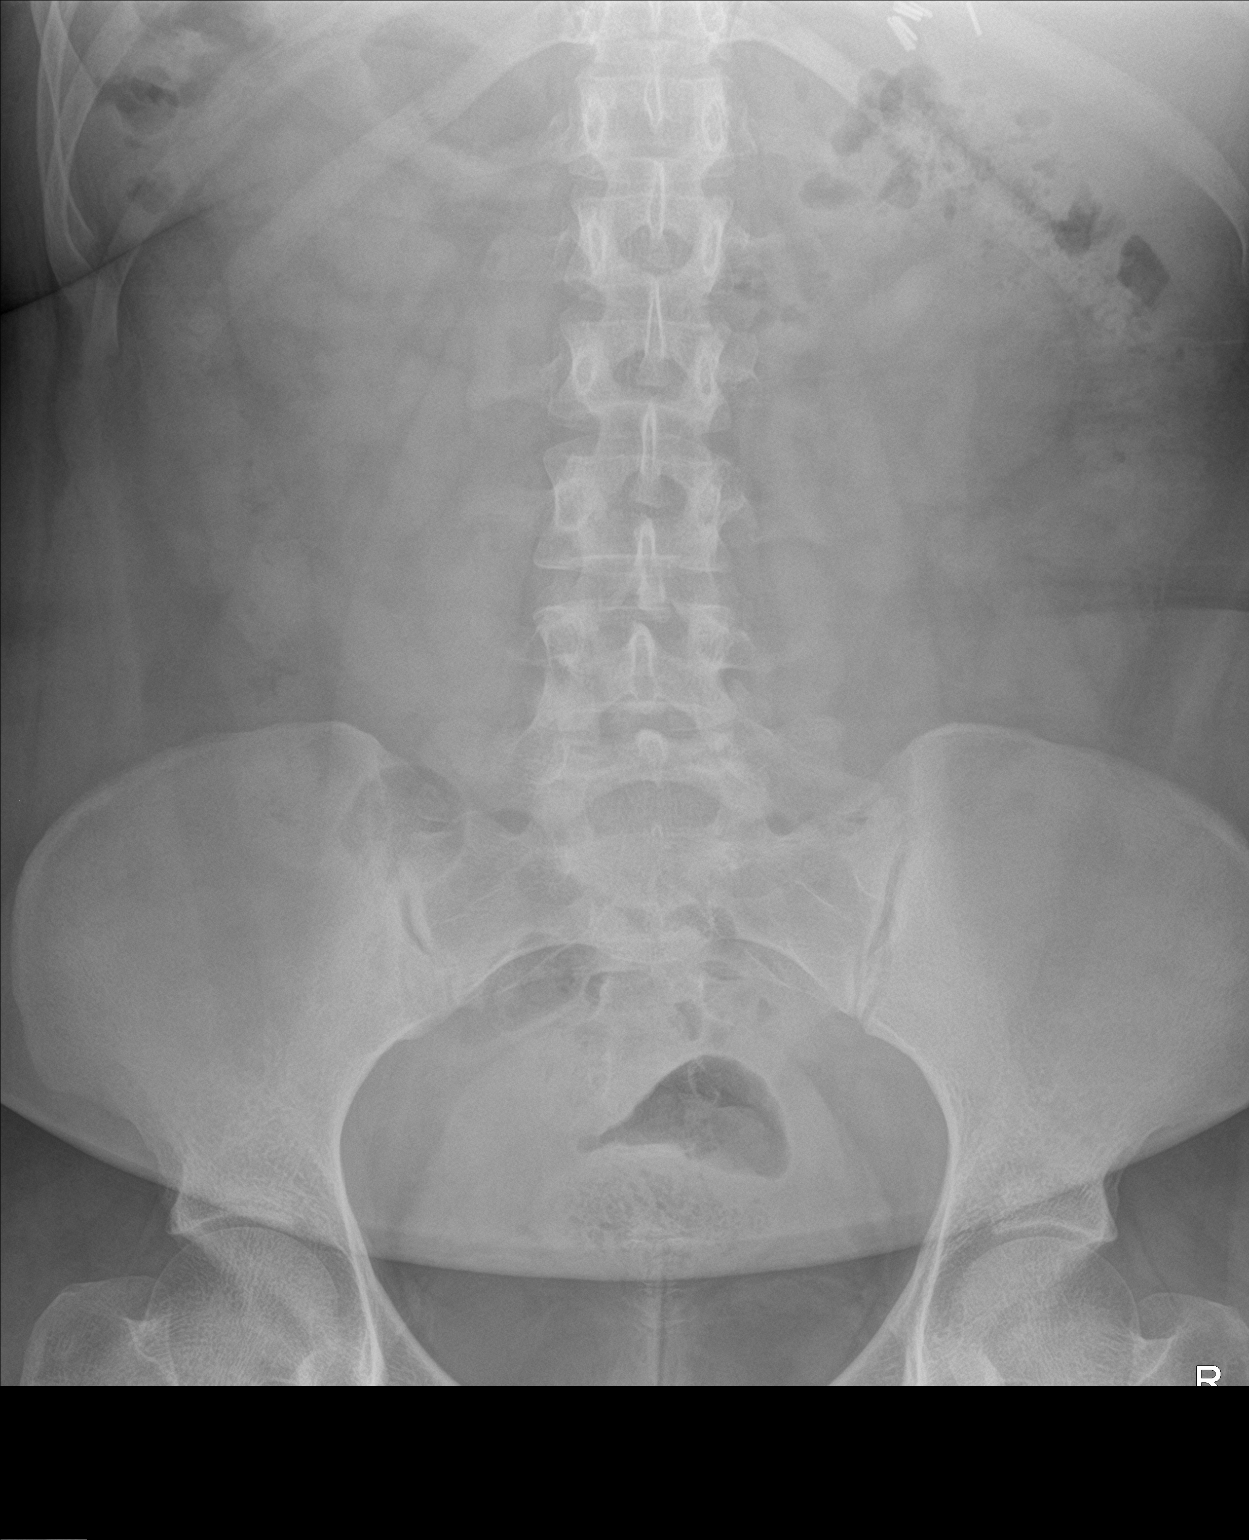

[2 of 2 positions shown; findings below may reference images not displayed]

FINDINGS: Surgical clips in the right upper quadrant. No gross free air.
Nonobstructed bowel-gas pattern. No abnormal calcifications.
IMPRESSION: Nonobstructed bowel-gas pattern

## 2021-01-27 ENCOUNTER — Other Ambulatory Visit: Payer: Self-pay

## 2021-01-27 ENCOUNTER — Ambulatory Visit
Admission: EM | Admit: 2021-01-27 | Discharge: 2021-01-27 | Disposition: A | Discharge status: Home / Self Care | Payer: BC Managed Care – PPO

## 2021-01-27 DIAGNOSIS — J069 Acute upper respiratory infection, unspecified: Secondary | ICD-10-CM | POA: Diagnosis not present

## 2021-01-27 DIAGNOSIS — Z20822 Contact with and (suspected) exposure to covid-19: Secondary | ICD-10-CM

## 2021-01-27 DIAGNOSIS — H66002 Acute suppurative otitis media without spontaneous rupture of ear drum, left ear: Secondary | ICD-10-CM

## 2021-01-27 MED ORDER — PSEUDOEPHEDRINE HCL 30 MG PO TABS: 30.0000 mg | ORAL_TABLET | Freq: Four times a day (QID) | ORAL | 0 refills | Status: AC | PRN

## 2021-01-27 MED ORDER — FLUTICASONE PROPIONATE 50 MCG/ACT NA SUSP: 1.0000 | Freq: Two times a day (BID) | NASAL | 2 refills | Status: DC

## 2021-01-27 MED ORDER — AMOXICILLIN 875 MG PO TABS: 875.0000 mg | ORAL_TABLET | Freq: Two times a day (BID) | ORAL | 0 refills | Status: AC

## 2021-01-27 NOTE — ED Triage Notes (Signed)
Patient states she started with a sore throat on Thursday and things have progressively gotten worse. She states she has congestion, runny nose with yellow mucus and both ears are hurting with an echo.  She is using her inhaler more and she tried TheraFlu, Elderberry syrup and Oscillococcimum. Last dose of TheraFlu last night at 11 pm.   Denies Fever. Took Covid test at home with negative results.

## 2021-01-27 NOTE — ED Provider Notes (Signed)
[facility]    CSN: [id number] Arrival date & time: 01/27/21  0254      History   Chief Complaint No chief complaint on file.   HPI Jenny Jackson is a 25 y.o. female.   Presenting today with 4-day history of progressively worsening congestion, postnasal drip, ear pressure and pain, cough.  States overnight last night her left ear started with sharp constant excruciating pain that caused her to not be able to sleep and cry all night.  She denies known fever, body aches, chest pain, shortness of breath, abdominal pain, nausea vomiting or diarrhea.  Multiple sick contacts recently.  History of seasonal allergies on occasional antihistamines.  Negative home COVID test since onset of symptoms.   Past Medical History:  Diagnosis Date   Anxiety    Asthma    Depression    Hypothyroidism     Patient Active Problem List   Diagnosis Date Noted   Gallstone of bile duct with obstruction 10/16/2015   Postpartum state 10/16/2015   Gallstones, common bile duct    Elevated LFTs    MDD (major depressive disorder), recurrent episode, severe (HCC) 08/09/2012   GAD (generalized anxiety disorder) 08/09/2012   ADHD (attention deficit hyperactivity disorder), inattentive type 08/09/2012   Asthma 04/22/2010   FRACTURE, RIGHT GREAT TOE 04/22/2010    Past Surgical History:  Procedure Laterality Date   CESAREAN SECTION     CHOLECYSTECTOMY N/A 11/05/2015   Procedure: LAPAROSCOPIC CHOLECYSTECTOMY;  Surgeon: Ancil Linsey, MD;  Location: [facility];  Service: General;  Laterality: N/A;    OB History   No obstetric history on file.      Home Medications    Prior to Admission medications   Medication Sig Start Date End Date Taking? Authorizing Provider  amoxicillin (AMOXIL) 875 MG tablet Take 1 tablet (875 mg total) by mouth 2 (two) times daily. 01/27/21  Yes Particia Nearing, PA-C  fluticasone [facility]) 50 MCG/ACT nasal spray Place 1 spray into both nostrils 2 (two)  times daily. 01/27/21  Yes Particia Nearing, PA-C  levonorgestrel-ethinyl estradiol (AVIANE) 0.1-20 MG-MCG tablet Take 1 tablet by mouth daily.   Yes [provider]  Levothyroxine Sodium 50 MCG CAPS Take 50 mcg by mouth daily before breakfast.   Yes [provider]  pseudoephedrine (SUDAFED) 30 MG tablet Take 1 tablet (30 mg total) by mouth every 6 (six) hours as needed for congestion. 01/27/21  Yes Particia Nearing, PA-C  albuterol (PROVENTIL HFA;VENTOLIN HFA) 108 (90 Base) MCG/ACT inhaler Inhale 2 puffs into the lungs every 6 (six) hours as needed for wheezing or shortness of breath.    [provider]  IRON PO Take 2 tablets by mouth daily.    [provider]  ondansetron (ZOFRAN) 4 MG tablet Take 1 tablet (4 mg total) by mouth every 6 (six) hours. 11/03/15   Nelva Nay, MD    Family History Family History  Problem Relation Age of Onset   Asthma Mother    Depression Mother    Hypotension Mother    Depression Sister    Asthma Sister    Seizures Sister    Cancer Maternal Aunt        Breast, and mouth   Cancer Maternal Grandfather    Colon cancer Neg Hx     Social History Social History   Tobacco Use   Smoking status: Never   Smokeless tobacco: Never  Substance Use Topics   Alcohol use: Yes  Comment: Occassionally   Drug use: No     Allergies   Patient has no known allergies.   Review of Systems Review of Systems Per HPI  Physical Exam Triage Vital Signs ED Triage Vitals  Enc Vitals Group     BP 01/27/21 1116 135/82     Pulse Rate 01/27/21 1116 90     Resp 01/27/21 1116 16     Temp 01/27/21 1116 98.5 F (36.9 C)     Temp Source 01/27/21 1116 Oral     SpO2 01/27/21 1116 97 %     Weight --      Height --      Head Circumference --      Peak Flow --      Pain Score 01/27/21 1110 4     Pain Loc --      Pain Edu? --      Excl. in GC? --    No data found.  Updated Vital Signs BP 135/82 (BP Location:  Right Arm)   Pulse 90   Temp 98.5 F (36.9 C) (Oral)   Resp 16   LMP 01/19/2021 (Approximate)   SpO2 97%   Breastfeeding No   Visual Acuity Right Eye Distance:   Left Eye Distance:   Bilateral Distance:    Right Eye Near:   Left Eye Near:    Bilateral Near:     Physical Exam Vitals and nursing note reviewed.  Constitutional:      Appearance: Normal appearance.  HENT:     Head: Atraumatic.     Right Ear: Tympanic membrane and external ear normal.     Left Ear: External ear normal.     Ears:     Comments: Right TM erythematous, edematous    Nose: Rhinorrhea present.     Mouth/Throat:     Mouth: Mucous membranes are moist.     Pharynx: Posterior oropharyngeal erythema present.  Eyes:     Extraocular Movements: Extraocular movements intact.     Conjunctiva/sclera: Conjunctivae normal.  Cardiovascular:     Rate and Rhythm: Normal rate and regular rhythm.     Heart sounds: Normal heart sounds.  Pulmonary:     Effort: Pulmonary effort is normal.     Breath sounds: Normal breath sounds. No wheezing or rales.  Musculoskeletal:        General: Normal range of motion.     Cervical back: Normal range of motion and neck supple.  Skin:    General: Skin is warm and dry.  Neurological:     Mental Status: She is alert and oriented to person, place, and time.  Psychiatric:        Mood and Affect: Mood normal.        Thought Content: Thought content normal.     UC Treatments / Results  Labs (all labs ordered are listed, but only abnormal results are displayed) Labs Reviewed  COVID-19, FLU A+B NAA    EKG   Radiology No results found.  Procedures Procedures (including critical care time)  Medications Ordered in UC Medications - No data to display  Initial Impression / Assessment and Plan / UC Course  I have reviewed the triage vital signs and the nursing notes.  Pertinent labs & imaging results that were available during my care of the patient were reviewed by  me and considered in my medical decision making (see chart for details).     Suspect initially viral upper respiratory infection now with secondary left  ear infection.  We will treat with Sudafed, Flonase, amoxicillin and await COVID and flu testing results.  Discussed supportive over-the-counter medicines and home care.  Return for acutely worsening symptoms.  Work note given.  Final Clinical Impressions(s) / UC Diagnoses   Final diagnoses:  Viral URI  Non-recurrent acute suppurative otitis media of left ear without spontaneous rupture of tympanic membrane   Discharge Instructions   None    ED Prescriptions     Medication Sig Dispense Auth. Provider   fluticasone (FLONASE) 50 MCG/ACT nasal spray Place 1 spray into both nostrils 2 (two) times daily. 16 g Particia Nearing, [place]   pseudoephedrine (SUDAFED) 30 MG tablet Take 1 tablet (30 mg total) by mouth every 6 (six) hours as needed for congestion. 30 tablet Particia Nearing, [place]   amoxicillin (AMOXIL) 875 MG tablet Take 1 tablet (875 mg total) by mouth 2 (two) times daily. 20 tablet Particia Nearing, [place]      PDMP not reviewed this encounter.   Particia Nearing, [place] 01/27/21 1206

## 2021-01-28 LAB — COVID-19, FLU A+B NAA
Influenza A, NAA: NOT DETECTED
Influenza B, NAA: NOT DETECTED
SARS-CoV-2, NAA: NOT DETECTED

## 2023-03-30 ENCOUNTER — Ambulatory Visit: Payer: 59 | Admitting: Podiatry

## 2023-08-12 ENCOUNTER — Other Ambulatory Visit: Payer: Self-pay

## 2023-08-12 ENCOUNTER — Emergency Department (HOSPITAL_COMMUNITY)
Admission: EM | Admit: 2023-08-12 | Discharge: 2023-08-12 | Disposition: A | Discharge status: Home / Self Care | Attending: Emergency Medicine | Admitting: Emergency Medicine

## 2023-08-12 ENCOUNTER — Encounter (HOSPITAL_COMMUNITY): Payer: Self-pay

## 2023-08-12 DIAGNOSIS — E039 Hypothyroidism, unspecified: Secondary | ICD-10-CM | POA: Diagnosis not present

## 2023-08-12 DIAGNOSIS — Z7951 Long term (current) use of inhaled steroids: Secondary | ICD-10-CM | POA: Diagnosis not present

## 2023-08-12 DIAGNOSIS — Z79899 Other long term (current) drug therapy: Secondary | ICD-10-CM | POA: Insufficient documentation

## 2023-08-12 DIAGNOSIS — R000 Tachycardia, unspecified: Secondary | ICD-10-CM | POA: Diagnosis not present

## 2023-08-12 DIAGNOSIS — J039 Acute tonsillitis, unspecified: Secondary | ICD-10-CM | POA: Insufficient documentation

## 2023-08-12 DIAGNOSIS — E860 Dehydration: Secondary | ICD-10-CM | POA: Insufficient documentation

## 2023-08-12 DIAGNOSIS — R509 Fever, unspecified: Secondary | ICD-10-CM | POA: Diagnosis present

## 2023-08-12 DIAGNOSIS — J45909 Unspecified asthma, uncomplicated: Secondary | ICD-10-CM | POA: Insufficient documentation

## 2023-08-12 LAB — GROUP A STREP BY PCR: Group A Strep by PCR: NOT DETECTED

## 2023-08-12 LAB — CBC WITH DIFFERENTIAL/PLATELET
Abs Immature Granulocytes: 0.04 10*3/uL (ref 0.00–0.07)
Basophils Absolute: 0.1 10*3/uL (ref 0.0–0.1)
Basophils Relative: 1 %
Eosinophils Absolute: 0 10*3/uL (ref 0.0–0.5)
Eosinophils Relative: 0 %
HCT: 36.8 % (ref 36.0–46.0)
Hemoglobin: 12 g/dL (ref 12.0–15.0)
Immature Granulocytes: 0 %
Lymphocytes Relative: 21 %
Lymphs Abs: 2.3 10*3/uL (ref 0.7–4.0)
MCH: 30.7 pg (ref 26.0–34.0)
MCHC: 32.6 g/dL (ref 30.0–36.0)
MCV: 94.1 fL (ref 80.0–100.0)
Monocytes Absolute: 1 10*3/uL (ref 0.1–1.0)
Monocytes Relative: 9 %
Neutro Abs: 7.6 10*3/uL (ref 1.7–7.7)
Neutrophils Relative %: 69 %
Platelets: 275 10*3/uL (ref 150–400)
RBC: 3.91 MIL/uL (ref 3.87–5.11)
RDW: 13.2 % (ref 11.5–15.5)
WBC: 10.9 10*3/uL — ABNORMAL HIGH (ref 4.0–10.5)
nRBC: 0 % (ref 0.0–0.2)

## 2023-08-12 LAB — RESP PANEL BY RT-PCR (RSV, FLU A&B, COVID)  RVPGX2
Influenza A by PCR: NEGATIVE
Influenza B by PCR: NEGATIVE
Resp Syncytial Virus by PCR: NEGATIVE
SARS Coronavirus 2 by RT PCR: NEGATIVE

## 2023-08-12 LAB — BASIC METABOLIC PANEL WITH GFR
Anion gap: 7 (ref 5–15)
BUN: 6 mg/dL (ref 6–20)
CO2: 25 mmol/L (ref 22–32)
Calcium: 8.4 mg/dL — ABNORMAL LOW (ref 8.9–10.3)
Chloride: 101 mmol/L (ref 98–111)
Creatinine, Ser: 0.87 mg/dL (ref 0.44–1.00)
GFR, Estimated: >60 mL/min (ref >60)
Glucose, Bld: 115 mg/dL — ABNORMAL HIGH (ref 70–99)
Potassium: 3.7 mmol/L (ref 3.5–5.1)
Sodium: 133 mmol/L — ABNORMAL LOW (ref 135–145)

## 2023-08-12 MED ORDER — LIDOCAINE VISCOUS HCL 2 % MT SOLN: 15.0000 mL | OROMUCOSAL | 0 refills | Status: AC | PRN

## 2023-08-12 MED ORDER — KETOROLAC TROMETHAMINE 30 MG/ML IJ SOLN
30.0000 mg | Freq: Once | INTRAMUSCULAR | Status: AC
  Administered 2023-08-12: 30 mg via INTRAVENOUS
  Filled 2023-08-12: qty 1

## 2023-08-12 MED ORDER — PREDNISONE 50 MG PO TABS: 50.0000 mg | ORAL_TABLET | Freq: Every day | ORAL | 0 refills | Status: AC

## 2023-08-12 MED ORDER — IBUPROFEN 600 MG PO TABS: 600.0000 mg | ORAL_TABLET | Freq: Four times a day (QID) | ORAL | 0 refills | Status: AC | PRN

## 2023-08-12 MED ORDER — ACETAMINOPHEN 500 MG PO TABS
1000.0000 mg | ORAL_TABLET | Freq: Once | ORAL | Status: AC
  Administered 2023-08-12: 1000 mg via ORAL
  Filled 2023-08-12: qty 2

## 2023-08-12 MED ORDER — DEXAMETHASONE SODIUM PHOSPHATE 10 MG/ML IJ SOLN
10.0000 mg | Freq: Once | INTRAMUSCULAR | Status: AC
  Administered 2023-08-12: 10 mg via INTRAVENOUS
  Filled 2023-08-12: qty 1

## 2023-08-12 MED ORDER — AMOXICILLIN-POT CLAVULANATE 875-125 MG PO TABS: 1.0000 | ORAL_TABLET | Freq: Two times a day (BID) | ORAL | 0 refills | Status: AC

## 2023-08-12 MED ORDER — SODIUM CHLORIDE 0.9 % IV BOLUS
1000.0000 mL | Freq: Once | INTRAVENOUS | Status: AC
  Administered 2023-08-12: 1000 mL via INTRAVENOUS

## 2023-08-12 MED ORDER — LIDOCAINE VISCOUS HCL 2 % MT SOLN
15.0000 mL | Freq: Once | OROMUCOSAL | Status: AC
  Administered 2023-08-12: 15 mL via OROMUCOSAL
  Filled 2023-08-12: qty 15

## 2023-08-12 NOTE — ED Provider Notes (Signed)
 [place] EMERGENCY DEPARTMENT AT [facility] Provider Note   CSN: [id number] Arrival date & time: 08/12/23  1821     History  Chief Complaint  Patient presents with   Jenny Jackson is a 28 y.o. female.  Pt is a 28 yo female with pmhx significant for asthma, depression, anxiety and hypothyroidism.  Pt has been sick since Monday (6/2).  She has had sore throat, fevers, and sweats.  She went to UC yesterday and was told she had thrush and a URI.  She was given a rx for zithromax, brompheniramine-pseudoephedrine -DM 30-2-10 MG/5ML syrup, and nystatin liquid.  She's been using those meds without improvement in sx.  Pt has not taken anything for fever.  She has not been eating/drinking much due to pain in her throat.         Home Medications Prior to Admission medications   Medication Sig Start Date End Date Taking? Authorizing Provider  amoxicillin -clavulanate (AUGMENTIN) 875-125 MG tablet Take 1 tablet by mouth every 12 (twelve) hours. 08/12/23  Yes Joslynne Klatt, MD  azithromycin (ZITHROMAX) 250 MG tablet Take by mouth daily.   Yes [provider]  brompheniramine-pseudoephedrine -DM 30-2-10 MG/5ML syrup Take 5 mLs by mouth 4 (four) times daily as needed.   Yes [provider]  fluconazole (DIFLUCAN) 200 MG tablet Take 200 mg by mouth daily.   Yes [provider]  ibuprofen  (ADVIL ) 600 MG tablet Take 1 tablet (600 mg total) by mouth every 6 (six) hours as needed. 08/12/23  Yes Sueellen Emery, MD  lidocaine  (XYLOCAINE ) 2 % solution Use as directed 15 mLs in the mouth or throat as needed for mouth pain. 08/12/23  Yes Philbert Ocallaghan, MD  nystatin (MYCOSTATIN) 100000 UNIT/ML suspension Take 5 mLs by mouth 4 (four) times daily.   Yes [provider]  predniSONE (DELTASONE) 50 MG tablet Take 1 tablet (50 mg total) by mouth daily with breakfast. 08/12/23  Yes Sueellen Emery, MD  albuterol  (PROVENTIL  HFA;VENTOLIN  HFA) 108 (90 Base)  MCG/ACT inhaler Inhale 2 puffs into the lungs every 6 (six) hours as needed for wheezing or shortness of breath.    [provider]  amoxicillin  (AMOXIL ) 875 MG tablet Take 1 tablet (875 mg total) by mouth 2 (two) times daily. 01/27/21   Corbin Dess, PA-C  fluticasone  (FLONASE ) 50 MCG/ACT nasal spray Place 1 spray into both nostrils 2 (two) times daily. 01/27/21   Corbin Dess, PA-C  IRON PO Take 2 tablets by mouth daily.    [provider]  levonorgestrel-ethinyl estradiol (AVIANE) 0.1-20 MG-MCG tablet Take 1 tablet by mouth daily.    [provider]  Levothyroxine Sodium 50 MCG CAPS Take 50 mcg by mouth daily before breakfast.    [provider]  ondansetron  (ZOFRAN ) 4 MG tablet Take 1 tablet (4 mg total) by mouth every 6 (six) hours. 11/03/15   Lonia Ro, MD  pseudoephedrine  (SUDAFED) 30 MG tablet Take 1 tablet (30 mg total) by mouth every 6 (six) hours as needed for congestion. 01/27/21   Corbin Dess, PA-C      Allergies    Patient has no known allergies.    Review of Systems   Review of Systems  Constitutional:  Positive for chills, fatigue and fever.  HENT:  Positive for sore throat.   All other systems reviewed and are negative.   Physical Exam Updated Vital Signs BP (!) 157/85 (BP Location: Right Arm)   Pulse (!) 113  Temp 99.9 F (37.7 C) (Oral)   Resp 18   Ht 5\' 1"  (1.549 m)   Wt 101.6 kg   LMP 08/02/2023 (Exact Date)   SpO2 98%   BMI 42.32 kg/m  Physical Exam Vitals and nursing note reviewed.  Constitutional:      Appearance: Normal appearance. She is obese.  HENT:     Head: Normocephalic and atraumatic.     Right Ear: External ear normal.     Left Ear: External ear normal.     Nose: Nose normal.     Mouth/Throat:     Mouth: Mucous membranes are dry.     Pharynx: Pharyngeal swelling and posterior oropharyngeal erythema present.  Eyes:     Extraocular Movements: Extraocular movements  intact.     Conjunctiva/sclera: Conjunctivae normal.     Pupils: Pupils are equal, round, and reactive to light.  Cardiovascular:     Rate and Rhythm: Regular rhythm. Tachycardia present.     Pulses: Normal pulses.     Heart sounds: Normal heart sounds.  Pulmonary:     Effort: Pulmonary effort is normal.     Breath sounds: Normal breath sounds.  Abdominal:     General: Abdomen is flat. Bowel sounds are normal.     Palpations: Abdomen is soft.  Musculoskeletal:        General: Normal range of motion.     Cervical back: Normal range of motion and neck supple.  Lymphadenopathy:     Cervical: Cervical adenopathy present.  Skin:    General: Skin is warm.     Capillary Refill: Capillary refill takes less than 2 seconds.  Neurological:     General: No focal deficit present.     Mental Status: She is alert and oriented to person, place, and time.  Psychiatric:        Mood and Affect: Mood normal.        Behavior: Behavior normal.     ED Results / Procedures / Treatments   Labs (all labs ordered are listed, but only abnormal results are displayed) Labs Reviewed  BASIC METABOLIC PANEL WITH GFR - Abnormal; Notable for the following components:      Result Value   Sodium 133 (*)    Glucose, Bld 115 (*)    Calcium 8.4 (*)    All other components within normal limits  CBC WITH DIFFERENTIAL/PLATELET - Abnormal; Notable for the following components:   WBC 10.9 (*)    All other components within normal limits  RESP PANEL BY RT-PCR (RSV, FLU A&B, COVID)  [id number]  GROUP A STREP BY PCR    EKG None  Radiology No results found.  Procedures Procedures    Medications Ordered in ED Medications  sodium chloride  0.9 % bolus 1,000 mL (1,000 mLs Intravenous New Bag/Given 08/12/23 1933)  ketorolac (TORADOL) 30 MG/ML injection 30 mg (30 mg Intravenous Given 08/12/23 1928)  acetaminophen  (TYLENOL ) tablet 1,000 mg (1,000 mg Oral Given 08/12/23 1921)  dexamethasone  (DECADRON ) injection 10 mg (10  mg Intravenous Given 08/12/23 1929)  lidocaine  (XYLOCAINE ) 2 % viscous mouth solution 15 mL (15 mLs Mouth/Throat Given 08/12/23 1921)    ED Course/ Medical Decision Making/ A&P                                 Medical Decision Making Amount and/or Complexity of Data Reviewed Labs: ordered.  Risk OTC drugs. Prescription drug management.   This  patient presents to the ED for concern of sore throat, fever, this involves an extensive number of treatment options, and is a complaint that carries with it a high risk of complications and morbidity.  The differential diagnosis includes covid/flu/rsv, strep   Co morbidities that complicate the patient evaluation   asthma, depression, anxiety and hypothyroidism   Additional history obtained:  Additional history obtained from epic chart review  Lab Tests:  I Ordered, and personally interpreted labs.  The pertinent results include:  covid/flu/rsv neg, strep neg, cbc nl, bmp nle  Medicines ordered and prescription drug management:  I ordered medication including ivfs/toradol/decadron /viscous lidocaine   for sx  Reevaluation of the patient after these medicines showed that the patient improved I have reviewed the patients home medicines and have made adjustments as needed  Critical Interventions:  ivfs   Problem List / ED Course:  Tonsillitis:  pt is neg for strep, but tonsils are red and beefy and she's febrile.  Zithromax stopped and pt put on augmentin and prednisone.  She is to stop the cough med and thrush med.  She is feeling better after fluids and meds.  She is tolerating po fluids.  She is stable for d/c.  Return if worse. F/u with pcp.   Reevaluation:  After the interventions noted above, I reevaluated the patient and found that they have :improved   Social Determinants of Health:  Lives at home   Dispostion:  After consideration of the diagnostic results and the patients response to treatment, I feel that the patent  would benefit from discharge with outpatient f/u.          Final Clinical Impression(s) / ED Diagnoses Final diagnoses:  Tonsillitis  Dehydration    Rx / DC Orders ED Discharge Orders          Ordered    ibuprofen  (ADVIL ) 600 MG tablet  Every 6 hours PRN        08/12/23 2040    amoxicillin -clavulanate (AUGMENTIN) 875-125 MG tablet  Every 12 hours        08/12/23 2040    lidocaine  (XYLOCAINE ) 2 % solution  As needed        08/12/23 2040    predniSONE (DELTASONE) 50 MG tablet  Daily with breakfast        08/12/23 2040              Sueellen Emery, MD 08/12/23 2043

## 2023-08-12 NOTE — Discharge Instructions (Signed)
 Stop the zithromax, nystatin, and cough medicine.

## 2023-08-12 NOTE — ED Triage Notes (Signed)
 Pt arrived via POV from home c/o URI/Thrush, generalized body aches and pains and a headache. Pt reports being evaluated at [facility] yesterday and being Dx with URI and Thrush and reports they tested her for Covid and Flu and everything came back Negative.

## 2024-02-28 ENCOUNTER — Encounter: Payer: Self-pay | Admitting: Emergency Medicine

## 2024-02-28 ENCOUNTER — Telehealth: Payer: Self-pay | Admitting: Emergency Medicine

## 2024-02-28 ENCOUNTER — Ambulatory Visit: Admission: EM | Admit: 2024-02-28 | Discharge: 2024-02-28 | Disposition: A | Discharge status: Home / Self Care

## 2024-02-28 DIAGNOSIS — J101 Influenza due to other identified influenza virus with other respiratory manifestations: Secondary | ICD-10-CM | POA: Diagnosis not present

## 2024-02-28 LAB — POC COVID19/FLU A&B COMBO
Covid Antigen, POC: NEGATIVE
Influenza A Antigen, POC: POSITIVE — AB
Influenza B Antigen, POC: NEGATIVE

## 2024-02-28 MED ORDER — PROMETHAZINE-DM 6.25-15 MG/5ML PO SYRP: 5.0000 mL | ORAL_SOLUTION | Freq: Four times a day (QID) | ORAL | 0 refills | Status: AC | PRN

## 2024-02-28 MED ORDER — OSELTAMIVIR PHOSPHATE 75 MG PO CAPS: 75.0000 mg | ORAL_CAPSULE | Freq: Two times a day (BID) | ORAL | 0 refills | Status: AC

## 2024-02-28 MED ORDER — FLUTICASONE PROPIONATE 50 MCG/ACT NA SUSP: 1.0000 | Freq: Two times a day (BID) | NASAL | 2 refills | Status: DC

## 2024-02-28 MED ORDER — FLUTICASONE PROPIONATE 50 MCG/ACT NA SUSP: 1.0000 | Freq: Two times a day (BID) | NASAL | 2 refills | Status: AC

## 2024-02-28 MED ORDER — OSELTAMIVIR PHOSPHATE 75 MG PO CAPS: 75.0000 mg | ORAL_CAPSULE | Freq: Two times a day (BID) | ORAL | 0 refills | Status: DC

## 2024-02-28 MED ORDER — PROMETHAZINE-DM 6.25-15 MG/5ML PO SYRP: 5.0000 mL | ORAL_SOLUTION | Freq: Four times a day (QID) | ORAL | 0 refills | Status: DC | PRN

## 2024-02-28 NOTE — Telephone Encounter (Signed)
 Patient wants medications sent to [facility] since [facility] is out of Tamiflu 

## 2024-02-28 NOTE — ED Triage Notes (Signed)
 Pt reports she has a cough, SHOB, sore throat, and sweats x 1 day

## 2024-02-28 NOTE — ED Provider Notes (Signed)
 " [facility]    CSN: [id number] Arrival date & time: 02/28/24  1257      History   Chief Complaint No chief complaint on file.   HPI Jenny Jackson is a 28 y.o. female.   Patient presenting today with 1 day history of cough, shortness of breath, nasal congestion, sore throat.  Denies fever, chills, chest pain, abdominal pain, vomiting, diarrhea.  So far tried Tylenol  with minimal relief.  Son sick with similar symptoms.  History of asthma on albuterol  as needed.    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      Home Medications    Prior to Admission medications  Medication Sig Start Date End Date Taking? Authorizing Provider  sertraline (ZOLOFT) 100 MG tablet Take 100 mg by mouth daily. 12/22/23  Yes [provider]  albuterol  (PROVENTIL  HFA;VENTOLIN  HFA) 108 (90 Base) MCG/ACT inhaler Inhale 2 puffs into the lungs every 6 (six) hours as needed for wheezing or shortness of breath.    [provider]  ALPRAZolam (XANAX) 0.5 MG tablet Take 0.5 mg by mouth daily.    [provider]  amoxicillin  (AMOXIL ) 875 MG tablet Take 1 tablet (875 mg total) by  mouth 2 (two) times daily. 01/27/21   Stuart Vernell Norris, PA-C  amoxicillin -clavulanate (AUGMENTIN ) 875-125 MG tablet Take 1 tablet by mouth every 12 (twelve) hours. 08/12/23   Haviland, Julie, MD  amphetamine-dextroamphetamine (ADDERALL) 30 MG tablet Take 1 tablet by mouth 2 (two) times daily.    [provider]  azithromycin (ZITHROMAX) 250 MG tablet Take by mouth daily.    [provider]  brompheniramine-pseudoephedrine -DM 30-2-10 MG/5ML syrup Take 5 mLs by mouth 4 (four) times daily as needed.    [provider]  fluconazole (DIFLUCAN) 200 MG tablet Take 200 mg by mouth daily.    [provider]  fluticasone  (FLONASE ) 50 MCG/ACT nasal spray Place 1 spray into both nostrils 2 (two) times daily. 02/28/24   Stuart Vernell Norris, PA-C  ibuprofen  (ADVIL ) 600 MG tablet Take 1 tablet (600 mg total) by mouth every 6 (six) hours as needed. 08/12/23   Haviland, Julie, MD  IRON PO Take 2 tablets by mouth daily.    [provider]  levonorgestrel-ethinyl estradiol (AVIANE) 0.1-20 MG-MCG tablet Take 1 tablet by mouth daily.    [provider]  Levothyroxine Sodium 50 MCG CAPS Take 50 mcg by mouth daily before breakfast.    [provider]  lidocaine  (XYLOCAINE ) 2 % solution Use as directed 15 mLs in the mouth or throat as needed for mouth pain. 08/12/23   Haviland, Julie, MD  nystatin (MYCOSTATIN)  100000 UNIT/ML suspension Take 5 mLs by mouth 4 (four) times daily.    [provider]  ondansetron  (ZOFRAN ) 4 MG tablet Take 1 tablet (4 mg total) by mouth every 6 (six) hours. 11/03/15   Lynnea Charleston, MD  oseltamivir  (TAMIFLU ) 75 MG capsule Take 1 capsule (75 mg total) by mouth every 12 (twelve) hours. 02/28/24   Stuart Vernell Norris, PA-C  predniSONE  (DELTASONE ) 50 MG tablet Take 1 tablet (50 mg total) by mouth daily with breakfast. 08/12/23   Dean Clarity, MD  promethazine -dextromethorphan (PROMETHAZINE -DM) 6.25-15 MG/5ML syrup Take 5  mLs by mouth 4 (four) times daily as needed. 02/28/24   Stuart Vernell Norris, PA-C  pseudoephedrine  (SUDAFED) 30 MG tablet Take 1 tablet (30 mg total) by mouth every 6 (six) hours as needed for congestion. 01/27/21   Stuart Vernell Norris, PA-C    Family History Family History  Problem Relation Age of Onset   Asthma Mother    Depression Mother    Hypotension Mother    Depression Sister    Asthma Sister    Seizures Sister    Cancer Maternal Aunt        Breast, and mouth   Cancer Maternal Grandfather    Colon cancer Neg Hx     Social History Social History[1]   Allergies   Patient has no known allergies.   Review of Systems Review of Systems Per HPI  Physical Exam Triage Vital Signs ED Triage Vitals  Encounter Vitals Group     BP 02/28/24 1327 133/82     Girls Systolic BP Percentile --      Girls Diastolic BP Percentile --      Boys Systolic BP Percentile --      Boys Diastolic BP Percentile --      Pulse Rate 02/28/24 1327 (!) 112     Resp 02/28/24 1327 16     Temp 02/28/24 1327 99.9 F (37.7 C)     Temp Source 02/28/24 1327 Oral     SpO2 02/28/24 1327 96 %     Weight --      Height --      Head Circumference --      Peak Flow --      Pain Score 02/28/24 1328 5     Pain Loc --      Pain Education --      Exclude from Growth Chart --    No data found.  Updated Vital Signs BP 133/82 (BP Location: Right Arm)   Pulse (!) 112   Temp 99.9 F (37.7 C) (Oral)   Resp 16   LMP 02/24/2024   SpO2 96%   Visual Acuity Right Eye Distance:   Left Eye Distance:   Bilateral Distance:    Right Eye Near:   Left Eye Near:    Bilateral Near:     Physical Exam Vitals and nursing note reviewed.  Constitutional:      Appearance: Normal appearance.  HENT:     Head: Atraumatic.     Right Ear: Tympanic membrane and external ear normal.     Left Ear: Tympanic membrane and external ear normal.     Nose: Rhinorrhea present.     Mouth/Throat:     Mouth: Mucous  membranes are moist.     Pharynx: Posterior oropharyngeal erythema present.  Eyes:     Extraocular Movements: Extraocular movements intact.     Conjunctiva/sclera: Conjunctivae normal.  Cardiovascular:     Rate and Rhythm: Regular rhythm.  Tachycardia present.     Heart sounds: Normal heart sounds.  Pulmonary:     Effort: Pulmonary effort is normal.     Breath sounds: Normal breath sounds. No wheezing or rales.  Musculoskeletal:        General: Normal range of motion.     Cervical back: Normal range of motion and neck supple.  Skin:    General: Skin is warm and dry.  Neurological:     Mental Status: She is alert and oriented to person, place, and time.  Psychiatric:        Mood and Affect: Mood normal.        Thought Content: Thought content normal.      UC Treatments / Results  Labs (all labs ordered are listed, but only abnormal results are displayed) Labs Reviewed  POC COVID19/FLU A&B COMBO - Abnormal; Notable for the following components:      Result Value   Influenza A Antigen, POC Positive (*)    All other components within normal limits    EKG   Radiology No results found.  Procedures Procedures (including critical care time)  Medications Ordered in UC Medications - No data to display  Initial Impression / Assessment and Plan / UC Course  I have reviewed the triage vital signs and the nursing notes.  Pertinent labs & imaging results that were available during my care of the patient were reviewed by me and considered in my medical decision making (see chart for details).     Overall vitals and exam reassuring today, roughly positive for influenza A.  She will Tamiflu , Flonase , Phenergan  DM, supportive over-the-counter medications and home care.  Return for worsening or unresolving symptoms.  Work note given.  Final Clinical Impressions(s) / UC Diagnoses   Final diagnoses:  Influenza A   Discharge Instructions   None    ED Prescriptions      Medication Sig Dispense Auth. Provider   oseltamivir  (TAMIFLU ) 75 MG capsule Take 1 capsule (75 mg total) by mouth every 12 (twelve) hours. 10 capsule Stuart Vernell Norris, PA-C   fluticasone  (FLONASE ) 50 MCG/ACT nasal spray Place 1 spray into both nostrils 2 (two) times daily. 16 g Stuart Vernell Norris, PA-C   promethazine -dextromethorphan (PROMETHAZINE -DM) 6.25-15 MG/5ML syrup Take 5 mLs by mouth 4 (four) times daily as needed. 100 mL Stuart Vernell Norris, [place]      PDMP not reviewed this encounter.    [1]  Social History Tobacco Use   Smoking status: Never   Smokeless tobacco: Never  Vaping Use   Vaping status: Never Used  Substance Use Topics   Alcohol use: Yes    Comment: Occassionally   Drug use: No     Stuart Vernell Norris, PA-C 02/28/24 1450  "

## 2024-02-29 ENCOUNTER — Ambulatory Visit: Payer: Self-pay
